# Patient Record
Sex: Female | Born: 1980 | Race: Black or African American | Hispanic: No | Marital: Married | Smoking: Never smoker
Health system: Southern US, Community
[De-identification: ages and names within clinical notes are randomized; demographics above are authoritative.]

## PROBLEM LIST (undated history)

## (undated) DIAGNOSIS — IMO0001 Reserved for inherently not codable concepts without codable children: Secondary | ICD-10-CM

## (undated) DIAGNOSIS — R112 Nausea with vomiting, unspecified: Secondary | ICD-10-CM

## (undated) DIAGNOSIS — E785 Hyperlipidemia, unspecified: Secondary | ICD-10-CM

## (undated) DIAGNOSIS — R739 Hyperglycemia, unspecified: Secondary | ICD-10-CM

## (undated) DIAGNOSIS — R012 Other cardiac sounds: Secondary | ICD-10-CM

## (undated) DIAGNOSIS — I498 Other specified cardiac arrhythmias: Secondary | ICD-10-CM

## (undated) DIAGNOSIS — E559 Vitamin D deficiency, unspecified: Secondary | ICD-10-CM

## (undated) DIAGNOSIS — R05 Cough: Secondary | ICD-10-CM

## (undated) DIAGNOSIS — T8859XA Other complications of anesthesia, initial encounter: Secondary | ICD-10-CM

## (undated) DIAGNOSIS — Z9889 Other specified postprocedural states: Secondary | ICD-10-CM

## (undated) DIAGNOSIS — I10 Essential (primary) hypertension: Secondary | ICD-10-CM

## (undated) DIAGNOSIS — R51 Headache: Secondary | ICD-10-CM

## (undated) DIAGNOSIS — R059 Cough, unspecified: Secondary | ICD-10-CM

## (undated) HISTORY — DX: Other specified cardiac arrhythmias: I49.8

## (undated) HISTORY — DX: Hyperglycemia, unspecified: R73.9

## (undated) HISTORY — PX: WISDOM TOOTH EXTRACTION: SHX21

## (undated) HISTORY — PX: NOVASURE ABLATION: SHX5394

## (undated) HISTORY — DX: Other cardiac sounds: R01.2

---

## 2001-08-04 ENCOUNTER — Other Ambulatory Visit: Admission: RE | Admit: 2001-08-04 | Discharge: 2001-08-04 | Payer: Self-pay | Admitting: Family Medicine

## 2001-08-11 ENCOUNTER — Encounter: Payer: Self-pay | Admitting: Family Medicine

## 2001-08-11 ENCOUNTER — Encounter: Admission: RE | Admit: 2001-08-11 | Discharge: 2001-08-11 | Payer: Self-pay | Admitting: Family Medicine

## 2003-02-22 ENCOUNTER — Other Ambulatory Visit: Admission: RE | Admit: 2003-02-22 | Discharge: 2003-02-22 | Payer: Self-pay | Admitting: Obstetrics and Gynecology

## 2003-05-24 ENCOUNTER — Other Ambulatory Visit: Admission: RE | Admit: 2003-05-24 | Discharge: 2003-05-24 | Payer: Self-pay | Admitting: Obstetrics and Gynecology

## 2003-12-03 ENCOUNTER — Ambulatory Visit (HOSPITAL_COMMUNITY): Admission: RE | Admit: 2003-12-03 | Discharge: 2003-12-03 | Payer: Self-pay | Admitting: Obstetrics and Gynecology

## 2003-12-10 ENCOUNTER — Encounter (INDEPENDENT_AMBULATORY_CARE_PROVIDER_SITE_OTHER): Payer: Self-pay | Admitting: Specialist

## 2003-12-10 ENCOUNTER — Inpatient Hospital Stay (HOSPITAL_COMMUNITY): Admission: AD | Admit: 2003-12-10 | Discharge: 2003-12-13 | Payer: Self-pay | Admitting: Obstetrics and Gynecology

## 2003-12-29 ENCOUNTER — Other Ambulatory Visit: Admission: RE | Admit: 2003-12-29 | Discharge: 2003-12-29 | Payer: Self-pay | Admitting: Obstetrics and Gynecology

## 2004-11-23 ENCOUNTER — Emergency Department (HOSPITAL_COMMUNITY): Admission: EM | Admit: 2004-11-23 | Discharge: 2004-11-23 | Payer: Self-pay | Admitting: Family Medicine

## 2005-06-26 IMAGING — US US OB COMP +14 WK
1 series · 13 of 28 positions shown · non-contrast
Comparison: none

CLINICAL DATA: 22-year-old.  G1 P0 with size greater than dates.  Evaluate fetal growth.  LMP 03/01/03 making the [REDACTED] weeks and 1 day.

[Series 1: unknown · 13 of 46 slices shown]
[im 2/46]
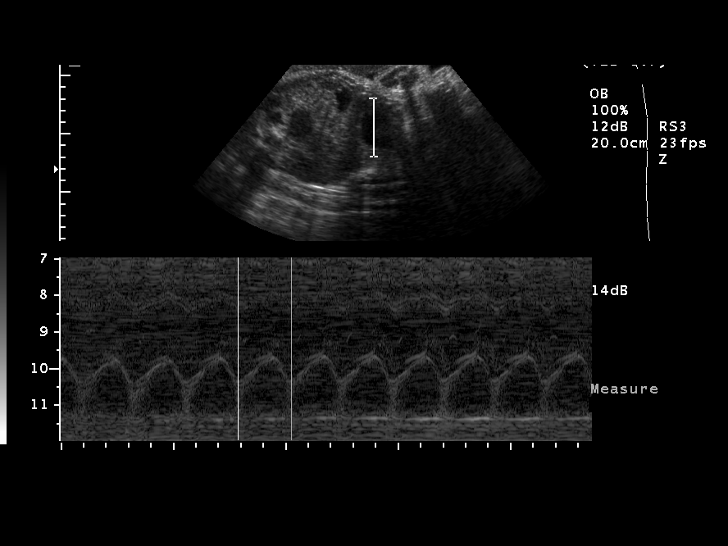
[im 6/46]
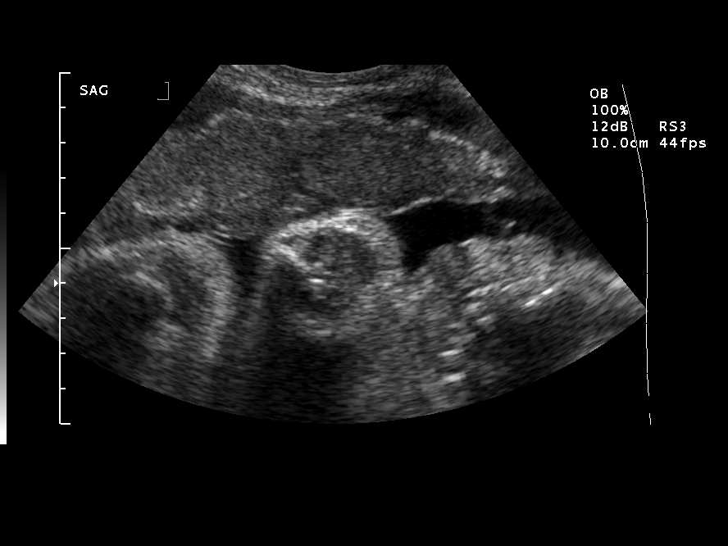
[im 9/46]
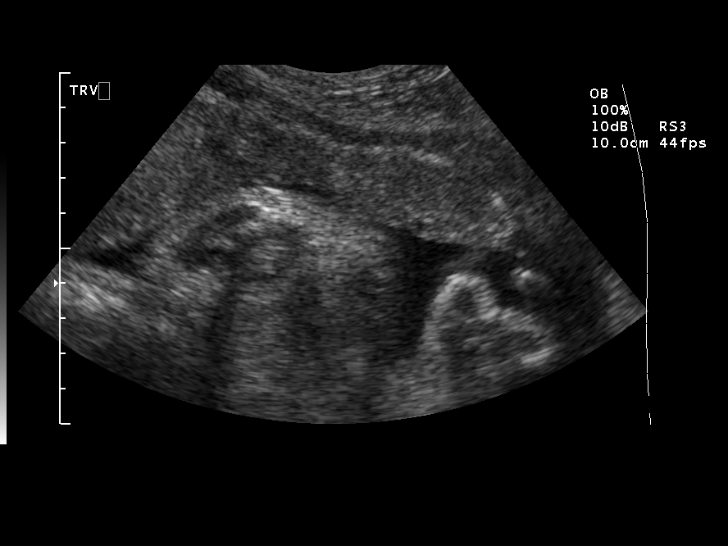
[im 12/46]
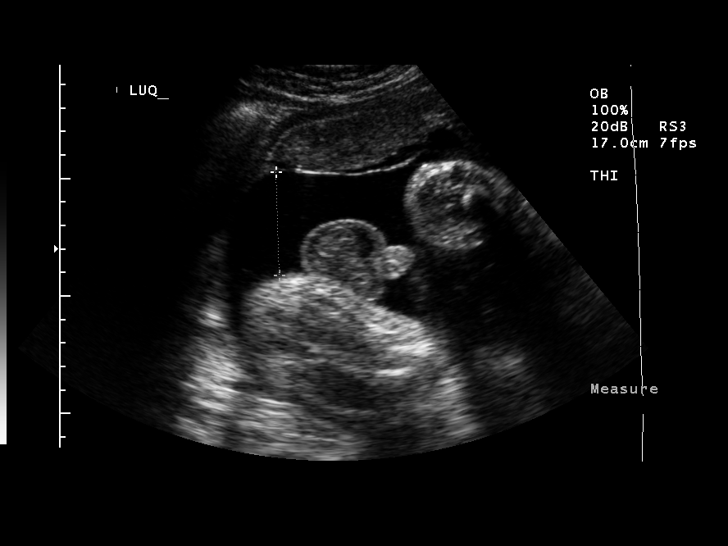
[im 16/46]
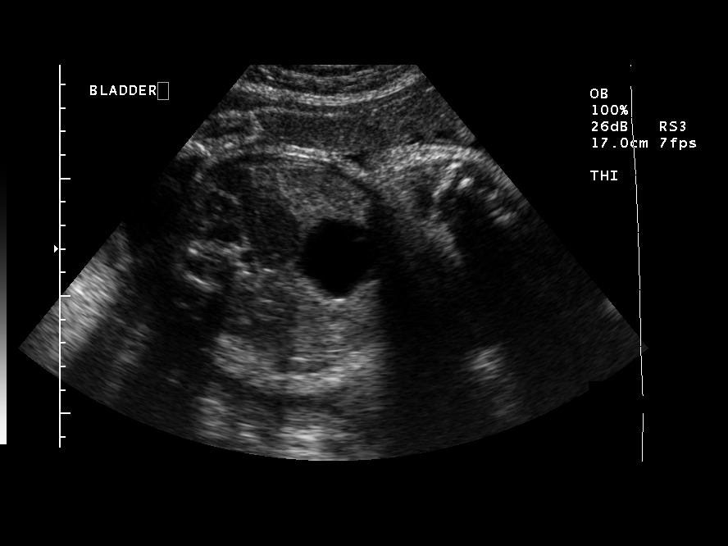
[im 19/46]
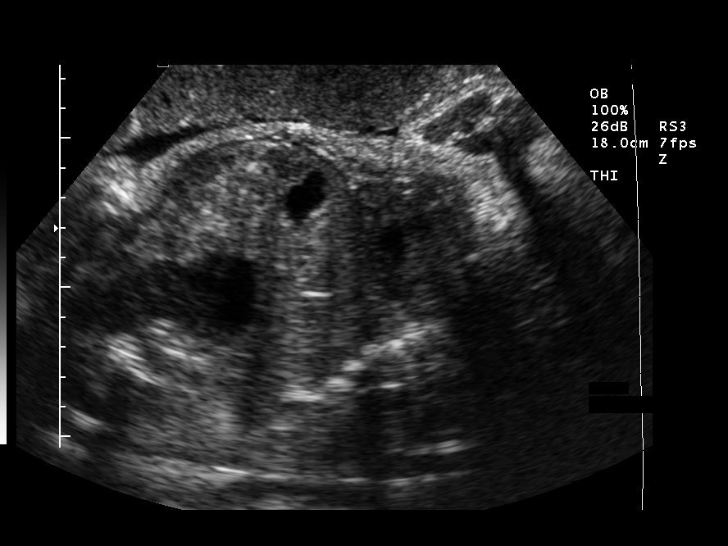
[im 24/46]
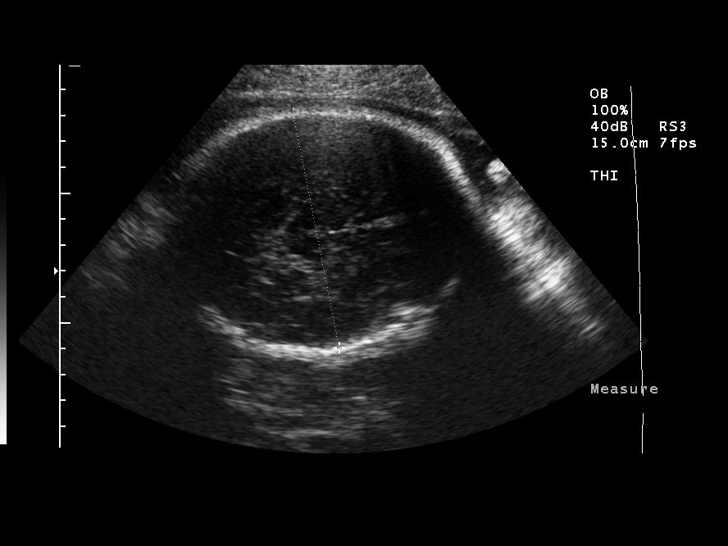
[im 27/46]
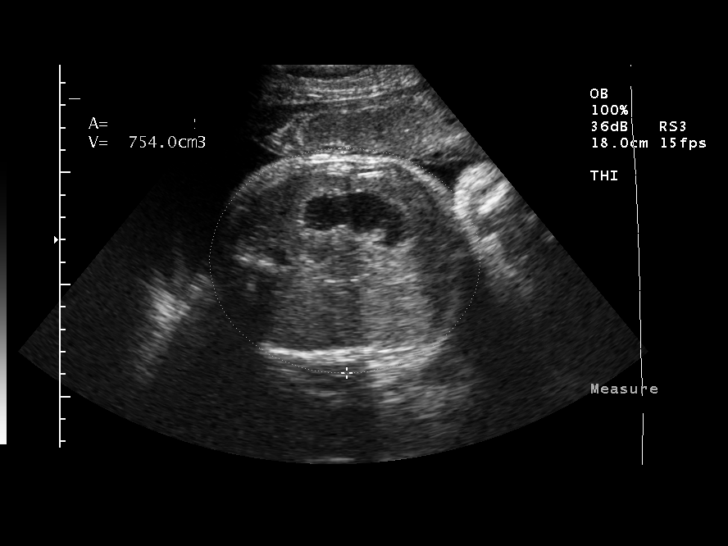
[im 31/46]
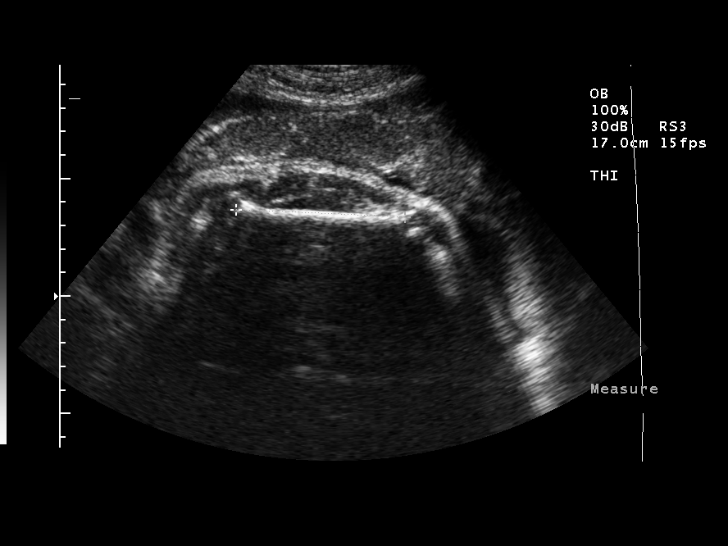
[im 34/46]
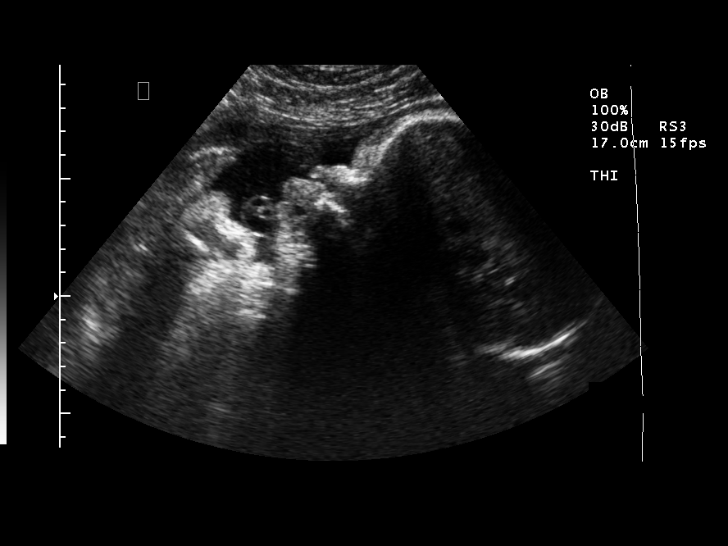
[im 37/46]
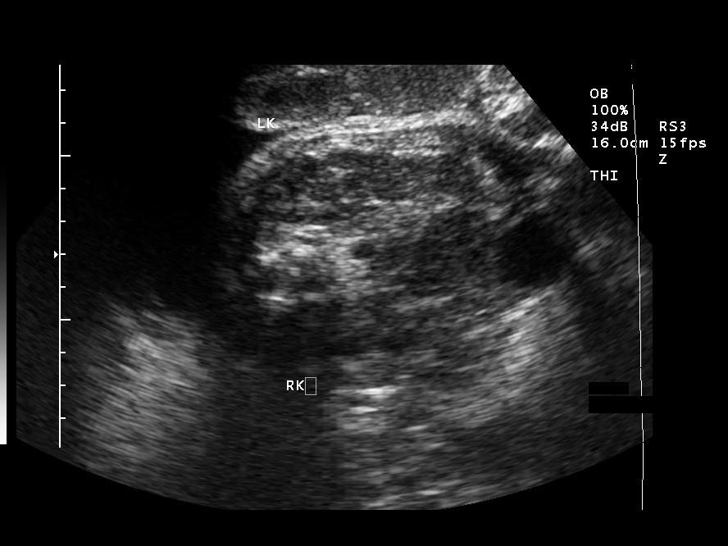
[im 41/46]
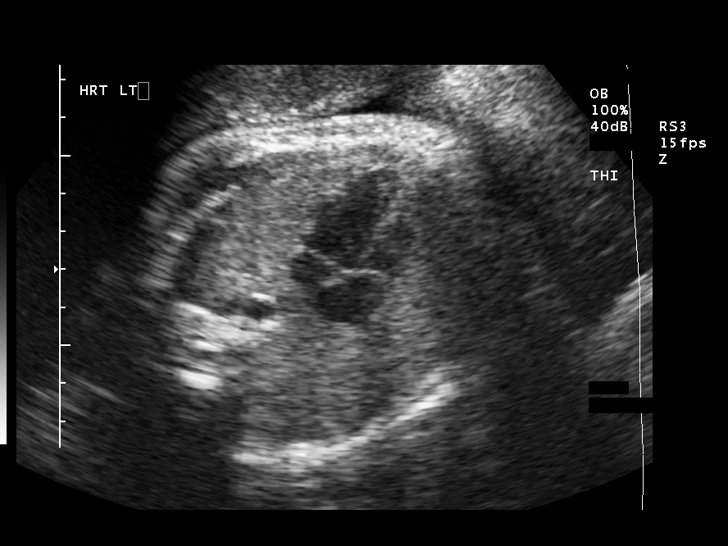
[im 44/46]
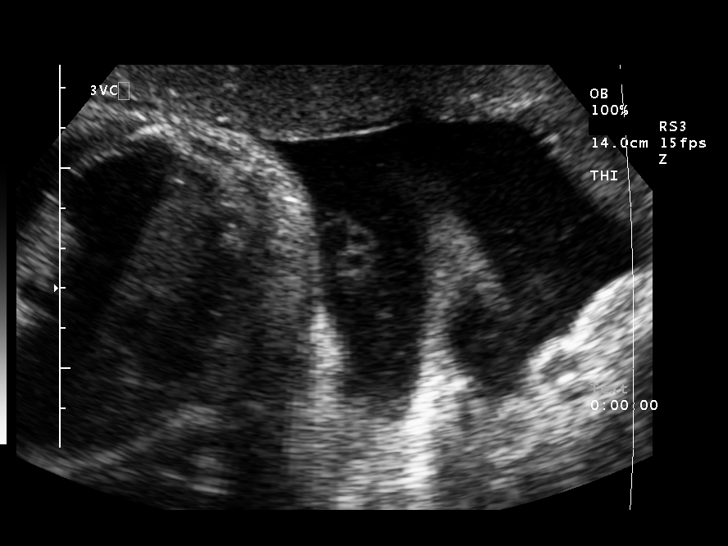

[13 of 28 positions shown; findings below may reference images not displayed]

OBSTETRICAL ULTRASOUND
 Number of Fetuses:  1
 Heart Rate:  127
 Movement:  Yes
 Breathing:  Yes  
 Presentation:  Transverse with head to maternal left
 Placental Location:  Anterior
 Grade:  II
 Previa:  No
 Amniotic Fluid (Subjective):  Normal
 Amniotic Fluid (Objective):   21.1 cm AFI (5th -95th%ile =   7.2 ? 22.6 cm for 39 wks)

 FETAL BIOMETRY
 BPD:   9.2 cm   37 w 1 d
 HC:   35.1 cm   40 w 6 d
 AC:   34.7 cm   38 w 5 d
 FL:    7.2 cm  36 w 6 d

 MEAN GA:  38 w 3 d

 EFW:  8280 g (H) 50th ? 75th%ile (0311 ? 5866 g) For 39 wks

 FETAL ANATOMY
 Lateral Ventricles:    Not visualized 
 Thalami/CSP:      Visualized 
 Posterior Fossa:  Not visualized 
 Nuchal Region:    N/A
 Spine:      Not visualized 
 4 Chamber Heart on Left:      Visualized 
 Stomach on Left:      Visualized 
 3 Vessel Cord:    Visualized 
 Cord Insertion site:    Not visualized 
 Kidneys:  Visualized 
 Bladder:  Visualized 
 Extremities:      Not visualized 

 ADDITIONAL ANATOMY VISUALIZED:  Upper lip, profile, diaphragm and male genitalia

 MATERNAL FINDINGS
 Cervix:   Not evaluated
IMPRESSION: Single living intrauterine fetus in transverse presentation.  Size and dates correlate well.  No fetal anomalies are seen.  Amniotic fluid volume is within normal limits.

 </u12:p>

## 2006-03-09 ENCOUNTER — Emergency Department (HOSPITAL_COMMUNITY): Admission: EM | Admit: 2006-03-09 | Discharge: 2006-03-09 | Payer: Self-pay | Admitting: Family Medicine

## 2006-07-09 ENCOUNTER — Inpatient Hospital Stay (HOSPITAL_COMMUNITY): Admission: AD | Admit: 2006-07-09 | Discharge: 2006-07-09 | Payer: Self-pay | Admitting: Obstetrics & Gynecology

## 2006-12-12 ENCOUNTER — Other Ambulatory Visit: Admission: RE | Admit: 2006-12-12 | Discharge: 2006-12-12 | Payer: Self-pay | Admitting: Obstetrics and Gynecology

## 2007-01-30 ENCOUNTER — Emergency Department (HOSPITAL_COMMUNITY): Admission: EM | Admit: 2007-01-30 | Discharge: 2007-01-30 | Payer: Self-pay | Admitting: Emergency Medicine

## 2007-03-04 ENCOUNTER — Ambulatory Visit (HOSPITAL_COMMUNITY): Admission: RE | Admit: 2007-03-04 | Discharge: 2007-03-04 | Payer: Self-pay | Admitting: Obstetrics and Gynecology

## 2007-04-11 ENCOUNTER — Inpatient Hospital Stay (HOSPITAL_COMMUNITY): Admission: AD | Admit: 2007-04-11 | Discharge: 2007-04-11 | Payer: Self-pay | Admitting: Obstetrics and Gynecology

## 2007-07-14 ENCOUNTER — Inpatient Hospital Stay (HOSPITAL_COMMUNITY): Admission: RE | Admit: 2007-07-14 | Discharge: 2007-07-17 | Payer: Self-pay | Admitting: Obstetrics and Gynecology

## 2009-03-18 ENCOUNTER — Emergency Department (HOSPITAL_COMMUNITY): Admission: EM | Admit: 2009-03-18 | Discharge: 2009-03-18 | Payer: Self-pay | Admitting: Family Medicine

## 2009-05-06 ENCOUNTER — Emergency Department (HOSPITAL_COMMUNITY): Admission: EM | Admit: 2009-05-06 | Discharge: 2009-05-06 | Payer: Self-pay | Admitting: Emergency Medicine

## 2009-07-23 ENCOUNTER — Emergency Department (HOSPITAL_COMMUNITY): Admission: EM | Admit: 2009-07-23 | Discharge: 2009-07-23 | Payer: Self-pay | Admitting: Family Medicine

## 2010-03-09 ENCOUNTER — Emergency Department (HOSPITAL_COMMUNITY): Admission: EM | Admit: 2010-03-09 | Discharge: 2010-03-09 | Payer: Self-pay | Admitting: Family Medicine

## 2010-08-23 ENCOUNTER — Other Ambulatory Visit: Payer: Self-pay | Admitting: Obstetrics and Gynecology

## 2010-11-06 LAB — POCT URINALYSIS DIP (DEVICE)
Bilirubin Urine: NEGATIVE
Ketones, ur: NEGATIVE mg/dL
Protein, ur: NEGATIVE mg/dL
Specific Gravity, Urine: 1.015 (ref 1.005–1.030)
pH: 6.5 (ref 5.0–8.0)

## 2010-12-19 NOTE — H&P (Signed)
[name], [name]                 ACCOUNT NO.:  [id number]   MEDICAL RECORD NO.:  [id number]          PATIENT TYPE:  INP   LOCATION:  NA                            [facility]   PHYSICIAN:  James A. Ashley Royalty, M.D.DATE OF BIRTH:  March 24, 1981   DATE OF ADMISSION:  07/14/2007  DATE OF DISCHARGE:                              HISTORY & PHYSICAL   This is a 30 year old gravida 2, para 1; EDC July 21, 2007; [redacted] weeks  gestation.  The patient had a previous cesarean section and desires  repeat cesarean section.   MEDICATIONS:  Vitamins.   PAST MEDICAL HISTORY:  Medical:  Negative.  Surgical:  As above.   ALLERGIES:  None.   FAMILY HISTORY:  Noncontributory.   SOCIAL HISTORY:  The patient denies the use of tobacco or significant  alcohol.   REVIEW OF SYSTEMS:  Noncontributory.   PHYSICAL EXAMINATION:  Well-developed, well-nourished, pleasant female.  No acute distress.  Afebrile, vital signs stable.  CHEST:  Lungs are clear.  CARDIAC:  Regular rate and rhythm.  ABDOMEN:  Gravid with a term fundal height.  Fetal heart tones are  auscultated and normal.  MUSCULOSKELETAL:  No CVA tenderness.  PELVIC:  Deferred.   IMPRESSION:  1. Intrauterine pregnancy at [redacted] weeks gestation.  2. Previous cesarean section with desire for repeat cesarean.   PLAN:  Repeat low transverse cesarean section.  Risks, benefits,  complications and alternatives fully discussed with the patient.  She  states she understands and accepts.  Questions are invited and answered.      James A. Ashley Royalty, M.D.  Electronically Signed     JAM/MEDQ  D:  07/14/2007  T:  07/14/2007  Job:  [id number]

## 2010-12-19 NOTE — Op Note (Signed)
[name], [name]                 ACCOUNT NO.:  [id number]   MEDICAL RECORD NO.:  [id number]          PATIENT TYPE:  INP   LOCATION:  9119                          [facility]   PHYSICIAN:  James A. Ashley Royalty, M.D.DATE OF BIRTH:  28-Mar-1981   DATE OF PROCEDURE:  07/14/2007  DATE OF DISCHARGE:                               OPERATIVE REPORT   PREOPERATIVE DIAGNOSIS:  Intrauterine pregnancy at [redacted] weeks gestation.  Previous cesarean section.   POSTOPERATIVE DIAGNOSIS:  Intrauterine pregnancy at [redacted] weeks gestation.  Previous cesarean section.   PROCEDURE:  Repeat low transverse cesarean section.   SURGEON:  Sylvester Harder, MD   ANESTHESIA:  Spinal.   FINDINGS:  6 pounds 5 ounces female, Apgars 8 at one minute, 9 at five  minutes, sent to newborn nursery.   ESTIMATED BLOOD LOSS:  500 mL.   COMPLICATIONS:  None.   PACKS AND DRAINS:  Foley.   SPONGE NEEDLE INSTRUMENT COUNT:  Reported as correct x2.   PROCEDURE:  The patient was taken to the operating room, placed in the  sitting position.  After spinal anesthetic was noted.  She was placed in  the dorsal supine position and prepped and draped in usual manner for  abdominal surgery.  Foley catheter was placed.  A Pfannenstiel incision  was made down to level of the fascia which was nicked with a knife,  incised transversely with Mayo scissors.  The underlying rectus muscles  separated from their fascia using sharp and blunt dissection.  Rectus  muscles were separated in the midline exposing the peritoneum which was  elevated with hemostats and entered atraumatically with Metzenbaum  scissors.  Incision was extended longitudinally.  There was a small  amount of omentum that was adherent to the anterior parietal peritoneum.  The uterus was identified and a bladder flap created by incising  intrauterine serosa and sharply and bluntly dissecting the bladder  inferiorly.  It was held in place with a bladder blade.  The uterus was  then entered through a low transverse incision using sharp and blunt  dissection.  The fluid was clear.  The infant was delivered from vertex  presentation in an atraumatic manner.  The infant was suctioned.  The  placenta and membranes were removed in their entirety and submitted to  the cord blood personnel for cord blood donation as well as regular cord  blood.  The uterus was exteriorized.  Uterus was then closed in two  running layers of #1 Vicryl.  The first was a running locking layer.  The second was a running, intermittently locking, and imbricating layer.  Two additional figure-of-eight sutures were required to obtain  hemostasis.  Hemostasis was noted.  Uterus, tubes and ovaries were  inspected and found be otherwise normal.  They were returned to the  abdominal cavity.  Copious irrigation was accomplished.  Hemostasis was  noted.  The small piece of omentum adherent to the anterior parietal  peritoneum was grasped with Kelly clamps divided and the pedicles  secured with 2-0 Vicryl ties.  Hemostasis was noted.   The peritoneum was  then closed with 3-0 Vicryl in a running fashion.  The fascia was closed with 0 Vicryl in a running fashion.  The skin was  closed with staples.   The patient tolerated procedure extremely well and was returned to the  recovery room in good condition.      James A. Ashley Royalty, M.D.  Electronically Signed     JAM/MEDQ  D:  07/14/2007  T:  07/15/2007  Job:  [id number]

## 2010-12-22 NOTE — Op Note (Signed)
[name], [name]                           ACCOUNT NO.:  [id number]   MEDICAL RECORD NO.:  [id number]                   PATIENT TYPE:  INP   LOCATION:  9122                                 [facility]   PHYSICIAN:  James A. Ashley Royalty, M.D.             DATE OF BIRTH:  09-27-1980   DATE OF PROCEDURE:  12/10/2003  DATE OF DISCHARGE:                                 OPERATIVE REPORT   PREOPERATIVE DIAGNOSES:  1. Intrauterine pregnancy at 40 weeks 1 day's gestation.  2. Oblique lie.   POSTOPERATIVE DIAGNOSES:  1. Intrauterine pregnancy at 40 weeks 1 day's gestation.  2. Meconium-stained amniotic fluid.   PROCEDURE:  Primary low transverse cesarean section.   SURGEON:  Rudy Jew. Ashley Royalty, M.D.   ASSISTANT:  Bing Neighbors. Sydnee Cabal, M.D.   FINDINGS:  A 7 pound 10 ounce female, Apgars 9 at one minute, 9 at five  minutes, sent to the newborn nursery.   ESTIMATED BLOOD LOSS:  800 mL.   COMPLICATIONS:  None.   PACKS AND DRAINS:  Foley.   Sponge, needle, and instrument count reported as correct x2.   PROCEDURE:  The patient was taken to the operating room and placed in the  sitting position.  After a spinal anesthetic was administered, she was  placed in the dorsal supine position and prepped and draped in the usual  manner for abdominal surgery.  A Foley catheter was placed.  A Pfannenstiel  incision was made down to the level of the fascia, which was nicked with a  knife, incised transversely with Mayo scissors.  The underlying rectus  muscles were separated from the overlying fascia using sharp and blunt  dissection.  The rectus muscles were separated in the midline exposing the  peritoneum, which was elevated with hemostats and entered atraumatically  with Metzenbaum scissors.  The incision was extended longitudinally.  The  uterus was identified and a bladder flap created by incising the anterior  uterine serosa and sharply and bluntly dissecting the bladder inferiorly.  It  was held in place with a bladder blade.  The uterus was then entered  through a low transverse incision using sharp and blunt dissection.  As the  amnion was incised, meconium-stained amniotic fluid was encountered.  The  infant was noted to be in an oblique position.  The head was in the right  lower quadrant.  The vertex was easily brought into the field and the infant  was then delivered from the vertex presentation.  DeLee suction was used  with delivery of the fetal head.  After the full body was delivered, the  cord was clamped, cut, and the infant given immediately to the awaiting  pediatrics team.  Arterial cord pH was obtained from an isolated segment of  cord.  Then regular cord was obtained.  The placenta and membranes were  removed in their entirety and submitted to pathology for histologic  studies.   The uterus was then closed in two running layers with #1 Vicryl.  The first  was a running locking layer.  The second was a running, intermittently  locking and imbricating layer. One additional figure-of-eight suture was  required to obtain hemostasis.  Hemostasis was noted.  The uterus, tubes,  and ovaries were inspected and found to be otherwise normal.  They were  returned to the abdominal cavity.  Copious irrigation was accomplished.  The  incision line was noted to be hemostatic.  The fascia was then closed with 0  Vicryl in a running fashion.  The skin was closed with staples.  The patient  tolerated the procedure extremely well and was returned to the recovery room  in good condition.                                               James A. Ashley Royalty, M.D.    JAM/MEDQ  D:  12/10/2003  T:  12/11/2003  Job:  [id number]

## 2010-12-22 NOTE — Discharge Summary (Signed)
[name], [name]                           ACCOUNT NO.:  [id number]   MEDICAL RECORD NO.:  [id number]                   PATIENT TYPE:  INP   LOCATION:  9122                                 [facility]   PHYSICIAN:  Charles A. Sydnee Cabal, MD            DATE OF BIRTH:  [birth year]   DATE OF ADMISSION:  12/10/2003  DATE OF DISCHARGE:  12/13/2003                                 DISCHARGE SUMMARY   PRIMARY DISCHARGE DIAGNOSES:  1. Intrauterine pregnancy at 40 weeks 1 day.  2. Oblique lie.   PRIMARY PROCEDURE:  Primary low transverse cesarean section.   HISTORY AND PHYSICAL:  See dictated note on the chart.   DISPOSITION:  The patient was discharged home to follow up with Dr. Ashley Royalty  in 10 days.  Staples were discontinued prior to discharge.  Percocet one to  two p.o. q.4h. p.r.n. prescription was given.  She was given convalescent  instructions to notify for temperature greater than 101, increased pain or  bleeding, incisional drainage, vaginal bleeding, nausea or vomiting.  She  was to convalesce at home, no driving for 2 weeks, pelvic rest for 4-6  weeks.   LABORATORY:  Postoperative hematocrit 32.6, hemoglobin 10.7 on Dec 11, 2003.   HOSPITAL COURSE:  The patient was admitted, underwent surgery as noted  above, cesarean section secondary to oblique lie at term.  Postoperatively  she had good pain control with Duramorph.  Postoperative day #1 she was  changed over to p.o. medication for pain - Percocet.  She did well.  Postoperative day #1 she voided without difficulty.  Foley catheter was  discontinued.  She had flatus return day #2.  A general diet was given.  She  tolerated the diet, was ambulating, voiding without difficulty.  Postoperative day #3, no noted complications, discharged home with follow-up  as noted above.   FINDINGS:  Vigorous female, 3480 gm, 51 cm in length.                                              Charles A. Sydnee Cabal, MD    CAD/MEDQ  D:   12/24/2003  T:  12/24/2003  Job:  [id number]

## 2010-12-22 NOTE — Discharge Summary (Signed)
NAMEJANACE, DECKER                           ACCOUNT NO.:  [id number]   MEDICAL RECORD NO.:  [id number]                   PATIENT TYPE:  INP   LOCATION:  9122                                 [facility]   PHYSICIAN:  Charles A. Sydnee Cabal, MD            DATE OF BIRTH:  [birth year]   DATE OF ADMISSION:  12/10/2003  DATE OF DISCHARGE:  12/13/2003                                 DISCHARGE SUMMARY   ADDENDUM:   FINDINGS:  Vigorous female, 3480 gm, 51 cm in length.                                               Charles A. Sydnee Cabal, MD    CAD/MEDQ  D:  12/24/2003  T:  12/24/2003  Job:  [id number]

## 2010-12-22 NOTE — Discharge Summary (Signed)
[name], [name]                 ACCOUNT NO.:  [id number]   MEDICAL RECORD NO.:  [id number]          PATIENT TYPE:  INP   LOCATION:  9119                          [facility]   PHYSICIAN:  James A. Ashley Royalty, M.D.DATE OF BIRTH:  January 14, 1981   DATE OF ADMISSION:  07/14/2007  DATE OF DISCHARGE:  07/17/2007                               DISCHARGE SUMMARY   DISCHARGE DIAGNOSES:  1. Intrauterine pregnancy at 12 weeks' gestation, delivered.  2. Previous cesarean section with desire for repeat cesarean.   OPERATIONS/PROCEDURES:  Repeat low transverse cesarean section.   COMPLICATIONS:  None.   DISCHARGE MEDICATIONS:  Percocet, Motrin 600 mg.   HISTORY AND PHYSICAL:  This is a 30 year old gravida 2, para 1 at 65  weeks' gestation.  She stated a desire for repeat cesarean section.  For  remaining history and physical, please see chart.   HOSPITAL COURSE:  The patient was admitted to [facility]  [place].  Admission laboratory studies were drawn.  She was taken to  the operating room on July 14, 2007 and underwent repeat low  transverse cesarean section which yielded a 6-pounds-5-ounce female,  Apgars 8 at 1 minute and 9 at 5 minutes, sent to the newborn nursery.  Delivery was accomplished by Sylvester Harder, M.D.  There were no  intraoperative complications.  The patient's postpartum course was  benign.  She was discharged on third postoperative day afebrile and in  satisfactory condition.   DISPOSITION:  The patient is to return to [facility] per  agreement for postpartum evaluation.      James A. Ashley Royalty, M.D.  Electronically Signed     JAM/MEDQ  D:  08/21/2007  T:  08/22/2007  Job:  [id number]

## 2010-12-22 NOTE — H&P (Signed)
[name], UTKE                           ACCOUNT NO.:  [id number]   MEDICAL RECORD NO.:  [id number]                   PATIENT TYPE:  INP   LOCATION:  NA                                   [facility]   PHYSICIAN:  James A. Ashley Royalty, M.D.             DATE OF BIRTH:  12/23/1980   DATE OF ADMISSION:  DATE OF DISCHARGE:                                HISTORY & PHYSICAL   HISTORY OF PRESENT ILLNESS:  A 30 year old primigravida with EDC Dec 09, 2003  at 40 weeks 1 day gestation.  Prenatal care has been essentially  uncomplicated.  The patient had an ultrasound at [facility] recently  for presentation and the result was transverse lie.  She is hence for  primary cesarean section assuming she has not converted to vertex.   MEDICATIONS:  Vitamins.   PAST MEDICAL HISTORY:  Medical:  Negative.  Surgical:  Negative.   ALLERGIES:  None.   FAMILY HISTORY:  Noncontributory.   SOCIAL HISTORY:  The patient denies the use of tobacco or significant  alcohol.   REVIEW OF SYSTEMS:  Noncontributory.   PHYSICAL EXAMINATION:  GENERAL:  Well-developed, well-nourished, pleasant  female, no acute distress.  VITAL SIGNS:  Afebrile, vital signs stable.  SKIN:  Warm and dry without lesions.  LYMPH:  There is no supraclavicular, cervical, or inguinal adenopathy.  HEENT:  Normocephalic.  NECK:  Supple without thyromegaly.  CHEST:  Lungs are clear.  CARDIAC:  Regular rate and rhythm without murmurs, gallops, or rubs.  BREAST:  Reveals no palpable mass, discharge retraction, or adenopathy.  ABDOMEN:  Gravid with a term fundal height.  Fetal heart tones are  auscultated with the Doppler.  MUSCULOSKELETAL:  Reveals full range of motion without edema, cyanosis, or  CVA tenderness.  PELVIC:  Deferred.   IMPRESSION:  1. Intrauterine pregnancy at 40 weeks 1 day gestation.  2. Transverse lie as of last ultrasound.   PLAN:  Will re-ultrasound the patient in the holding area prior to  transporting her to the operating room.  Assuming the lie is still  transverse she will be a candidate for a primary cesarean section.  Risks,  benefits, complications, and alternatives fully discussed with the patient.  She states she understands and accepts.  Questions invited and answered.                                               James A. Ashley Royalty, M.D.    JAM/MEDQ  D:  12/10/2003  T:  12/10/2003  Job:  [id number]

## 2011-01-15 LAB — HIV ANTIBODY (ROUTINE TESTING W REFLEX): HIV: NONREACTIVE

## 2011-01-15 LAB — RUBELLA ANTIBODY, IGM: Rubella: IMMUNE

## 2011-02-04 ENCOUNTER — Ambulatory Visit (INDEPENDENT_AMBULATORY_CARE_PROVIDER_SITE_OTHER): Payer: BC Managed Care – PPO

## 2011-02-04 ENCOUNTER — Encounter (HOSPITAL_COMMUNITY): Payer: Self-pay

## 2011-02-04 ENCOUNTER — Inpatient Hospital Stay (INDEPENDENT_AMBULATORY_CARE_PROVIDER_SITE_OTHER)
Admission: RE | Admit: 2011-02-04 | Discharge: 2011-02-04 | Disposition: A | Discharge status: Home / Self Care | Payer: BC Managed Care – PPO | Source: Ambulatory Visit | Attending: Emergency Medicine | Admitting: Emergency Medicine

## 2011-02-04 DIAGNOSIS — M25519 Pain in unspecified shoulder: Secondary | ICD-10-CM

## 2011-05-14 LAB — CBC
HCT: 34 — ABNORMAL LOW
Hemoglobin: 10.2 — ABNORMAL LOW
Hemoglobin: 11.5 — ABNORMAL LOW
MCHC: 33.6
MCHC: 33.7
MCV: 76.4 — ABNORMAL LOW
MCV: 76.5 — ABNORMAL LOW
Platelets: 239
RBC: 3.96
RDW: 14.3
WBC: 9.9

## 2011-05-18 LAB — URINALYSIS, ROUTINE W REFLEX MICROSCOPIC
Glucose, UA: NEGATIVE
Hgb urine dipstick: NEGATIVE
Ketones, ur: NEGATIVE
Protein, ur: NEGATIVE
pH: 5.5

## 2011-05-23 LAB — POCT I-STAT CREATININE
Creatinine, Ser: 0.6
Operator id: 247071

## 2011-05-23 LAB — I-STAT 8, (EC8 V) (CONVERTED LAB)
Acid-base deficit: 1
Chloride: 103
Glucose, Bld: 82
Hemoglobin: 13.6
Potassium: 3.7
Sodium: 134 — ABNORMAL LOW
TCO2: 25
pH, Ven: 7.4 — ABNORMAL HIGH

## 2011-05-23 LAB — POCT RAPID STREP A: Streptococcus, Group A Screen (Direct): NEGATIVE

## 2011-06-06 ENCOUNTER — Other Ambulatory Visit: Payer: Self-pay | Admitting: Obstetrics and Gynecology

## 2011-07-12 ENCOUNTER — Other Ambulatory Visit: Payer: Self-pay | Admitting: Obstetrics and Gynecology

## 2011-07-13 ENCOUNTER — Other Ambulatory Visit: Payer: Self-pay | Admitting: Obstetrics and Gynecology

## 2011-07-16 ENCOUNTER — Encounter (HOSPITAL_COMMUNITY): Payer: Self-pay | Admitting: Pharmacist

## 2011-07-19 ENCOUNTER — Encounter (HOSPITAL_COMMUNITY): Payer: Self-pay | Admitting: *Deleted

## 2011-07-19 ENCOUNTER — Inpatient Hospital Stay (HOSPITAL_COMMUNITY): Payer: BC Managed Care – PPO

## 2011-07-19 ENCOUNTER — Inpatient Hospital Stay (HOSPITAL_COMMUNITY)
Admission: AD | Admit: 2011-07-19 | Discharge: 2011-07-19 | Disposition: A | Discharge status: Home / Self Care | Payer: BC Managed Care – PPO | Source: Ambulatory Visit | Attending: Obstetrics and Gynecology | Admitting: Obstetrics and Gynecology

## 2011-07-19 DIAGNOSIS — O169 Unspecified maternal hypertension, unspecified trimester: Secondary | ICD-10-CM

## 2011-07-19 DIAGNOSIS — IMO0002 Reserved for concepts with insufficient information to code with codable children: Secondary | ICD-10-CM

## 2011-07-19 DIAGNOSIS — O139 Gestational [pregnancy-induced] hypertension without significant proteinuria, unspecified trimester: Secondary | ICD-10-CM | POA: Insufficient documentation

## 2011-07-19 LAB — COMPREHENSIVE METABOLIC PANEL
Alkaline Phosphatase: 161 U/L — ABNORMAL HIGH (ref 39–117)
BUN: 7 mg/dL (ref 6–23)
CO2: 23 mEq/L (ref 19–32)
GFR calc Af Amer: >90 mL/min (ref >90)
GFR calc non Af Amer: >90 mL/min (ref >90)
Glucose, Bld: 93 mg/dL (ref 70–99)
Potassium: 4 mEq/L (ref 3.5–5.1)
Total Bilirubin: 0.2 mg/dL — ABNORMAL LOW (ref 0.3–1.2)
Total Protein: 7.5 g/dL (ref 6.0–8.3)

## 2011-07-19 LAB — CBC
HCT: 35.2 % — ABNORMAL LOW (ref 36.0–46.0)
MCH: 23.6 pg — ABNORMAL LOW (ref 26.0–34.0)
MCHC: 31.5 g/dL (ref 30.0–36.0)
MCV: 74.9 fL — ABNORMAL LOW (ref 78.0–100.0)
Platelets: 275 10*3/uL (ref 150–400)
RDW: 14.5 % (ref 11.5–15.5)

## 2011-07-19 LAB — URINE MICROSCOPIC-ADD ON

## 2011-07-19 LAB — URINALYSIS, ROUTINE W REFLEX MICROSCOPIC
Bilirubin Urine: NEGATIVE
Glucose, UA: NEGATIVE mg/dL
Ketones, ur: NEGATIVE mg/dL
Protein, ur: NEGATIVE mg/dL
Urobilinogen, UA: 0.2 mg/dL (ref 0.0–1.0)

## 2011-07-19 NOTE — Progress Notes (Signed)
Sent from office, BP has been running high.  Denies headache, no visual changes or epigastric pain.  Ankles and hands a little swollen.

## 2011-07-19 NOTE — H&P (Addendum)
See full H+P by NP.    Pt sent over from office for eval of increased BPs.  BPs here initially elevated at 140/100s (highest 160/107)  but now are  consistently 147/87, 131/78, 135/78.  PIH labs were all normal.  NST is reactive but had one decel to 100s that appeared to be a moderate variable mixed with maternal HR.  BPP is 8/8 and AFI is 9.5 (23rd%ile).    Currently PIH only with no s/s of preeclampsia.  Antenatal testing is reassuring.  Will d/c home with precautions and kick counts.  RTC tomorrow for BP check at office.    [name] A

## 2011-07-19 NOTE — ED Provider Notes (Signed)
History     Chief Complaint  Patient presents with  . Hypertension   HPI Pt here for PIH eval.  BP was 150/90 in the office.  Dr. Henderson Cloud called to give orders Pt denies headache or abdominal pain.  She is having occ ctx not felt by pt.  She denies leakage of fluid or vaginal bleeding.  Pt has a history of borderline BP and has had elevated BP in her pregnancy. No past medical history on file.  No past surgical history on file.  No family history on file.  History  Substance Use Topics  . Smoking status: Not on file  . Smokeless tobacco: Not on file  . Alcohol Use: Not on file    Allergies: No Known Allergies  Prescriptions prior to admission  Medication Sig Dispense Refill  . prenatal vitamin w/FE, FA (PRENATAL 1 + 1) 27-1 MG TABS Take 1 tablet by mouth daily.          ROS Physical Exam   There were no vitals taken for this visit.  Physical Exam  MAU Course  Procedures  PIH labs normal and reported to Dr. Henderson Cloud along with serial blood pressures- will watch BPs and report to Dr. Henderson Cloud [facility] labs normal; last BP 139/88 FHR reactive however, 4 minute deceleration noted- will get BPP Dr. Henderson Cloud notified  BPP 8/8- reported to Dr. Henderson Cloud- AFI low normal ultrasound called- total AFI requested Assessment and Plan  Hypertension in pregnancy- normal PIH labs F/u with Dr. Sheralyn Boatman 07/19/2011, 11:22 AM

## 2011-07-19 NOTE — Progress Notes (Signed)
Pt sent over for pih eval from office due to elevated blood pressures.  Denies any headache, dizziness, blurred vision, or epigastric pain.  Denies any bleeding or leaking of fluid.  + FM.

## 2011-07-19 NOTE — Progress Notes (Signed)
The fetal tracing from [phone] belongs to the Patient with MR # [id number]. Pt was discharged home and this patient came in and was put on monitor under her MR#. See note in other chart.

## 2011-07-20 ENCOUNTER — Encounter (HOSPITAL_COMMUNITY): Payer: Self-pay | Admitting: *Deleted

## 2011-07-20 ENCOUNTER — Inpatient Hospital Stay (HOSPITAL_COMMUNITY)
Admission: AD | Admit: 2011-07-20 | Discharge: 2011-07-20 | Disposition: A | Discharge status: Home / Self Care | Payer: BC Managed Care – PPO | Source: Ambulatory Visit | Attending: Obstetrics and Gynecology | Admitting: Obstetrics and Gynecology

## 2011-07-20 DIAGNOSIS — IMO0002 Reserved for concepts with insufficient information to code with codable children: Secondary | ICD-10-CM

## 2011-07-20 DIAGNOSIS — O139 Gestational [pregnancy-induced] hypertension without significant proteinuria, unspecified trimester: Secondary | ICD-10-CM | POA: Insufficient documentation

## 2011-07-20 LAB — URINE CULTURE
Colony Count: >=100000
Culture  Setup Time: 201212140119

## 2011-07-20 MED ORDER — ACETAMINOPHEN 500 MG PO TABS
1000.0000 mg | ORAL_TABLET | Freq: Once | ORAL | Status: AC
  Administered 2011-07-20: 1000 mg via ORAL
  Filled 2011-07-20: qty 2

## 2011-07-20 NOTE — ED Provider Notes (Signed)
Chief Complaint:  Hypertension   [name] is  30 y.o. [id number].  No LMP recorded. Patient is pregnant..  [redacted]w[redacted]d   She presents complaining of Hypertension . Pt was sent for further evaluation of headache and elevated BP found in office today. States US and labs yesterday. Headache started today, + FM, no visual disturbance, no edema, no N/V.  Obstetrical/Gynecological History: OB History    Grav Para Term Preterm Abortions TAB SAB Ect Mult Living   3 2 2  0 0 0 0 0 0 2      Past Medical History: Past Medical History  Diagnosis Date  . No pertinent past medical history     Past Surgical History: Past Surgical History  Procedure Date  . Cesarean section     x2    Family History: Family History  Problem Relation Age of Onset  . Hypertension Mother   . Diabetes Mother   . Hypertension Father   . Hypertension Sister   . Anesthesia problems Neg Hx     Social History: History  Substance Use Topics  . Smoking status: Never Smoker   . Smokeless tobacco: Never Used  . Alcohol Use: No    Allergies: No Known Allergies  Prescriptions prior to admission  Medication Sig Dispense Refill  . prenatal vitamin w/FE, FA (PRENATAL 1 + 1) 27-1 MG TABS Take 1 tablet by mouth daily.          Review of Systems - Negative except what has been reviewed in the HPI  Physical Exam   Blood pressure 138/85, pulse 95, temperature 97.7 F (36.5 C), temperature source Oral, resp. rate 20, height 5\' 5"  (1.651 m), weight 238 lb (107.956 kg).  General: General appearance - alert, well appearing, and in no distress, oriented to person, place, and time and overweight Mental status - alert, oriented to person, place, and time, normal mood, behavior, speech, dress, motor activity, and thought processes, affect appropriate to mood Abdomen - gravid, non tender Extremities - peripheral pulses normal, no pedal edema, no clubbing or cyanosis, reflexes NL Focused Gynecological Exam: examination not  indicated FHR: Category I tracing  ED Course: Dr. Claiborne Billings notified of pt BP, FHR tracing by RN. Orders received by RN for discharge and tylenol.  Assessment: Gestational HTN  Plan: FU Monday in office for BP check. Scheduled for Repeat C/S on Wednesday Pre-x precautions reviewed  Lc Joynt E. 07/20/2011,1:14 PM

## 2011-07-20 NOTE — Progress Notes (Signed)
Sent by Dr. Henderson Cloud for elevated BP during pregnancy.  Seen in office this morning.

## 2011-07-20 NOTE — Patient Instructions (Addendum)
   Your procedure is scheduled on:  Wednesday, Dec 19th  Enter through the [employer] [facility] at: 7:00am Pick up the phone at the desk and dial [phone] and inform us of your arrival.  Please call this number if you have any problems the morning of surgery: [phone]  Remember: Do not eat food after midnight: Tuesday Do not drink clear liquids after: Tuesday Take these medicines the morning of surgery with a SIP OF WATER: None  Do not wear jewelry, make-up, or FINGER nail polish Do not wear lotions, powders, perfumes or deodorant. Do not shave 48 hours prior to surgery. Do not bring valuables to the hospital.  Leave suitcase in the car. After Surgery it may be brought to your room. For patients being admitted to the hospital, checkout time is 11:00am the day of discharge. Home with Husband [name]  cell [phone]  Remember to use your hibiclens as instructed.Please shower with 1/2 bottle the evening before your surgery and the other 1/2 bottle the morning of surgery.

## 2011-07-21 NOTE — ED Provider Notes (Signed)
ok 

## 2011-07-23 ENCOUNTER — Encounter (HOSPITAL_COMMUNITY): Payer: Self-pay

## 2011-07-23 ENCOUNTER — Inpatient Hospital Stay (HOSPITAL_COMMUNITY)
Admission: AD | Admit: 2011-07-23 | Discharge: 2011-07-23 | Disposition: A | Discharge status: Home / Self Care | Payer: BC Managed Care – PPO | Source: Ambulatory Visit | Attending: Obstetrics & Gynecology | Admitting: Obstetrics & Gynecology

## 2011-07-23 ENCOUNTER — Encounter (HOSPITAL_COMMUNITY)
Admission: RE | Admit: 2011-07-23 | Discharge: 2011-07-23 | Disposition: A | Discharge status: Home / Self Care | Payer: BC Managed Care – PPO | Source: Ambulatory Visit | Attending: Obstetrics and Gynecology | Admitting: Obstetrics and Gynecology

## 2011-07-23 DIAGNOSIS — R030 Elevated blood-pressure reading, without diagnosis of hypertension: Secondary | ICD-10-CM | POA: Insufficient documentation

## 2011-07-23 DIAGNOSIS — O99891 Other specified diseases and conditions complicating pregnancy: Secondary | ICD-10-CM | POA: Insufficient documentation

## 2011-07-23 HISTORY — DX: Cough, unspecified: R05.9

## 2011-07-23 HISTORY — DX: Reserved for inherently not codable concepts without codable children: IMO0001

## 2011-07-23 HISTORY — DX: Essential (primary) hypertension: I10

## 2011-07-23 HISTORY — DX: Cough: R05

## 2011-07-23 HISTORY — DX: Headache: R51

## 2011-07-23 LAB — CBC
HCT: 34.9 % — ABNORMAL LOW (ref 36.0–46.0)
HCT: 33.9 % — ABNORMAL LOW (ref 36.0–46.0)
Hemoglobin: 10.8 g/dL — ABNORMAL LOW (ref 12.0–15.0)
Hemoglobin: 10.9 g/dL — ABNORMAL LOW (ref 12.0–15.0)
MCH: 23.1 pg — ABNORMAL LOW (ref 26.0–34.0)
MCH: 23.9 pg — ABNORMAL LOW (ref 26.0–34.0)
MCHC: 30.9 g/dL (ref 30.0–36.0)
MCHC: 32.2 g/dL (ref 30.0–36.0)
MCV: 74.6 fL — ABNORMAL LOW (ref 78.0–100.0)
RBC: 4.56 MIL/uL (ref 3.87–5.11)
RDW: 15 % (ref 11.5–15.5)

## 2011-07-23 LAB — COMPREHENSIVE METABOLIC PANEL
ALT: 7 U/L (ref 0–35)
Alkaline Phosphatase: 168 U/L — ABNORMAL HIGH (ref 39–117)
BUN: 6 mg/dL (ref 6–23)
CO2: 22 mEq/L (ref 19–32)
Calcium: 8.9 mg/dL (ref 8.4–10.5)
GFR calc Af Amer: >90 mL/min (ref >90)
GFR calc non Af Amer: >90 mL/min (ref >90)
Glucose, Bld: 80 mg/dL (ref 70–99)
Sodium: 134 mEq/L — ABNORMAL LOW (ref 135–145)
Total Protein: 7 g/dL (ref 6.0–8.3)

## 2011-07-23 LAB — URINALYSIS, ROUTINE W REFLEX MICROSCOPIC
Glucose, UA: NEGATIVE mg/dL
Ketones, ur: NEGATIVE mg/dL
Protein, ur: NEGATIVE mg/dL
Urobilinogen, UA: 0.2 mg/dL (ref 0.0–1.0)

## 2011-07-23 LAB — URINE MICROSCOPIC-ADD ON

## 2011-07-23 LAB — LACTATE DEHYDROGENASE: LDH: 157 U/L (ref 94–250)

## 2011-07-23 MED ORDER — ACETAMINOPHEN 325 MG PO TABS
650.0000 mg | ORAL_TABLET | Freq: Once | ORAL | Status: AC
  Administered 2011-07-23: 650 mg via ORAL
  Filled 2011-07-23: qty 2

## 2011-07-23 NOTE — ED Provider Notes (Signed)
I reviewed note by Easton Rice, PA.  Her last 2 BP's were 120/80 and 145/86.  The fetal tracing is reactive and the patient reports good fetal activity.  All her PIH labs were normal and her urine protein was negative.  I do not feel that immediate delivery is indicated.  She will rest at home tonight and has an appointment to be seen by Dr. Horvath in the office tomorrow. 

## 2011-07-23 NOTE — ED Provider Notes (Signed)
History   Pt presents today for preeclamptic workup. She is scheduled for C/Section on Wed of this week. At her pre-op appt today, she was noted to have elevated BP and was sent to MAU for evaluation. She denies blurry vision, RUQ pain, or any other sx at this time. She reports GFM and denies vag dc or bleeding.  No chief complaint on file.  HPI  OB History    Grav Para Term Preterm Abortions TAB SAB Ect Mult Living   3 2 2  0 0 0 0 0 0 2      Past Medical History  Diagnosis Date  . Hypertension     prior to Pregnant, borderline but no meds  . Cold     sx 3 weeks ago - no meds  . Cough     sx x 3 weeks ago, no meds  . Headache      HA - tx w/otc med prn    Past Surgical History  Procedure Date  . Cesarean section 12/2003, 07/2007    x2    Family History  Problem Relation Age of Onset  . Hypertension Mother   . Diabetes Mother   . Hypertension Father   . Hypertension Sister   . Anesthesia problems Neg Hx     History  Substance Use Topics  . Smoking status: Never Smoker   . Smokeless tobacco: Never Used  . Alcohol Use: No    Allergies: No Known Allergies  Prescriptions prior to admission  Medication Sig Dispense Refill  . prenatal vitamin w/FE, FA (PRENATAL 1 + 1) 27-1 MG TABS Take 1 tablet by mouth daily.          Review of Systems  Constitutional: Negative for fever and malaise/fatigue.  Eyes: Negative for blurred vision.  Cardiovascular: Negative for chest pain and palpitations.  Gastrointestinal: Negative for nausea, vomiting, abdominal pain, diarrhea and constipation.  Genitourinary: Negative for dysuria, urgency, frequency and hematuria.  Neurological: Positive for headaches. Negative for dizziness.  Psychiatric/Behavioral: Negative for depression and suicidal ideas.   Physical Exam   There were no vitals taken for this visit.  Physical Exam  Nursing note and vitals reviewed. Constitutional: She is oriented to person, place, and time. She appears  well-developed and well-nourished. No distress.  HENT:  Head: Normocephalic and atraumatic.  Eyes: EOM are normal. Pupils are equal, round, and reactive to light.  GI: Soft. She exhibits no distension. There is no tenderness. There is no rebound and no guarding.  Neurological: She is alert and oriented to person, place, and time.  Skin: Skin is warm and dry. She is not diaphoretic.  Psychiatric: She has a normal mood and affect. Her behavior is normal. Judgment and thought content normal.    MAU Course  Procedures  Discussed pt with Dr. Arlyce Dice. He is on his way to evaluate pt.  Results for orders placed during the hospital encounter of 07/23/11 (from the past 24 hour(s))  URINALYSIS, ROUTINE W REFLEX MICROSCOPIC     Status: Abnormal   Collection Time   07/23/11  2:45 PM      Component Value Range   Color, Urine YELLOW  YELLOW    APPearance CLOUDY (*) CLEAR    Specific Gravity, Urine 1.010  1.005 - 1.030    pH 6.5  5.0 - 8.0    Glucose, UA NEGATIVE  NEGATIVE (mg/dL)   Hgb urine dipstick TRACE (*) NEGATIVE    Bilirubin Urine NEGATIVE  NEGATIVE    Ketones,  ur NEGATIVE  NEGATIVE (mg/dL)   Protein, ur NEGATIVE  NEGATIVE (mg/dL)   Urobilinogen, UA 0.2  0.0 - 1.0 (mg/dL)   Nitrite NEGATIVE  NEGATIVE    Leukocytes, UA LARGE (*) NEGATIVE   URINE MICROSCOPIC-ADD ON     Status: Abnormal   Collection Time   07/23/11  2:45 PM      Component Value Range   Squamous Epithelial / LPF MANY (*) RARE    WBC, UA 7-10  <3 (WBC/hpf)   Bacteria, UA FEW (*) RARE    Urine-Other MUCOUS PRESENT    CBC     Status: Abnormal   Collection Time   07/23/11  2:46 PM      Component Value Range   WBC 11.0 (*) 4.0 - 10.5 (K/uL)   RBC 4.56  3.87 - 5.11 (MIL/uL)   Hemoglobin 10.9 (*) 12.0 - 15.0 (g/dL)   HCT 16.1 (*) 09.6 - 46.0 (%)   MCV 74.3 (*) 78.0 - 100.0 (fL)   MCH 23.9 (*) 26.0 - 34.0 (pg)   MCHC 32.2  30.0 - 36.0 (g/dL)   RDW 04.5  40.9 - 81.1 (%)   Platelets 216  150 - 400 (K/uL)  COMPREHENSIVE  METABOLIC PANEL     Status: Abnormal   Collection Time   07/23/11  2:46 PM      Component Value Range   Sodium 134 (*) 135 - 145 (mEq/L)   Potassium 4.0  3.5 - 5.1 (mEq/L)   Chloride 101  96 - 112 (mEq/L)   CO2 22  19 - 32 (mEq/L)   Glucose, Bld 80  70 - 99 (mg/dL)   BUN 6  6 - 23 (mg/dL)   Creatinine, Ser 9.14  0.50 - 1.10 (mg/dL)   Calcium 8.9  8.4 - 78.2 (mg/dL)   Total Protein 7.0  6.0 - 8.3 (g/dL)   Albumin 2.6 (*) 3.5 - 5.2 (g/dL)   AST 11  0 - 37 (U/L)   ALT 7  0 - 35 (U/L)   Alkaline Phosphatase 168 (*) 39 - 117 (U/L)   Total Bilirubin 0.2 (*) 0.3 - 1.2 (mg/dL)   GFR calc non Af Amer >90  >90 (mL/min)   GFR calc Af Amer >90  >90 (mL/min)  LACTATE DEHYDROGENASE     Status: Normal   Collection Time   07/23/11  2:46 PM      Component Value Range   LD 157  94 - 250 (U/L)  URIC ACID     Status: Normal   Collection Time   07/23/11  2:46 PM      Component Value Range   Uric Acid, Serum 4.0  2.4 - 7.0 (mg/dL)     Assessment and Plan  Dr. Arlyce Dice to assume care of this pt.  Clinton Gallant. Rice III, DrHSc, MPAS, PA-C  07/23/2011, 2:50 PM   Henrietta Hoover, PA 07/23/11 1556

## 2011-07-23 NOTE — ED Provider Notes (Signed)
I reviewed note by Henrietta Hoover, PA.  Her last 2 BP's were 120/80 and 145/86.  The fetal tracing is reactive and the patient reports good fetal activity.  All her PIH labs were normal and her urine protein was negative.  I do not feel that immediate delivery is indicated.  She will rest at home tonight and has an appointment to be seen by Dr. Henderson Cloud in the office tomorrow.

## 2011-07-23 NOTE — ED Notes (Signed)
Care assumed from [facility] RN

## 2011-07-23 NOTE — Pre-Procedure Instructions (Signed)
Spoke with Dr Rodman Pickle regarding patient's elevated BP at PAT appt  - 163/107 w/120pulse and at end of the PAT appt 161/101 w/106 pulse.  Patient has been on bedrest since Friday, Dec 14th for elevated BP.  Patient was in MAU on Thursday and Friday last week for elevated bp 160/107 and was D/C with BP 138/65.  Instructed to call Dr Henderson Cloud, reviewed information, instructed to take patient down to MAU for evaluation.

## 2011-07-23 NOTE — Progress Notes (Signed)
Schedule C/S Wednesday, had PAT appt today and BP 163/107 and 161/101, MD sent to MAU for evaluation

## 2011-07-24 ENCOUNTER — Encounter (HOSPITAL_COMMUNITY): Payer: Self-pay | Admitting: Registered Nurse

## 2011-07-24 ENCOUNTER — Encounter (HOSPITAL_COMMUNITY): Payer: Self-pay | Admitting: *Deleted

## 2011-07-24 ENCOUNTER — Inpatient Hospital Stay (HOSPITAL_COMMUNITY)
Admission: AD | Admit: 2011-07-24 | Discharge: 2011-07-27 | DRG: 371 | Disposition: A | Discharge status: Home / Self Care | Payer: BC Managed Care – PPO | Source: Ambulatory Visit | Attending: Obstetrics and Gynecology | Admitting: Obstetrics and Gynecology

## 2011-07-24 ENCOUNTER — Encounter (HOSPITAL_COMMUNITY)
Admission: AD | Disposition: A | Discharge status: Home / Self Care | Payer: Self-pay | Source: Ambulatory Visit | Attending: Obstetrics and Gynecology

## 2011-07-24 ENCOUNTER — Inpatient Hospital Stay (HOSPITAL_COMMUNITY): Payer: BC Managed Care – PPO | Admitting: Registered Nurse

## 2011-07-24 DIAGNOSIS — Z9889 Other specified postprocedural states: Secondary | ICD-10-CM

## 2011-07-24 DIAGNOSIS — Z302 Encounter for sterilization: Secondary | ICD-10-CM

## 2011-07-24 DIAGNOSIS — O34219 Maternal care for unspecified type scar from previous cesarean delivery: Principal | ICD-10-CM | POA: Diagnosis present

## 2011-07-24 LAB — TYPE AND SCREEN: ABO/RH(D): O POS

## 2011-07-24 LAB — CBC
Platelets: 225 10*3/uL (ref 150–400)
RBC: 4.6 MIL/uL (ref 3.87–5.11)
WBC: 10.1 10*3/uL (ref 4.0–10.5)

## 2011-07-24 SURGERY — Surgical Case
Anesthesia: Regional | Site: Abdomen | Wound class: Clean Contaminated

## 2011-07-24 MED ORDER — METOCLOPRAMIDE HCL 5 MG/ML IJ SOLN
INTRAMUSCULAR | Status: AC
  Administered 2011-07-24: 10 mg via INTRAVENOUS
  Filled 2011-07-24: qty 2

## 2011-07-24 MED ORDER — METHYLERGONOVINE MALEATE 0.2 MG/ML IJ SOLN: 0.2000 mg | INTRAMUSCULAR | Status: DC | PRN

## 2011-07-24 MED ORDER — LACTATED RINGERS IV BOLUS (SEPSIS)
500.0000 mL | Freq: Once | INTRAVENOUS | Status: AC
  Administered 2011-07-24: 1000 mL via INTRAVENOUS

## 2011-07-24 MED ORDER — LABETALOL HCL 5 MG/ML IV SOLN
10.0000 mg | Freq: Every day | INTRAVENOUS | Status: AC | PRN
  Administered 2011-07-24: 10 mg via INTRAVENOUS
  Filled 2011-07-24: qty 4

## 2011-07-24 MED ORDER — SODIUM CHLORIDE 0.9 % IV SOLN: 1.0000 ug/kg/h | INTRAVENOUS | Status: DC | PRN

## 2011-07-24 MED ORDER — CITRIC ACID-SODIUM CITRATE 334-500 MG/5ML PO SOLN
30.0000 mL | Freq: Once | ORAL | Status: AC
  Administered 2011-07-24: 30 mL via ORAL
  Filled 2011-07-24: qty 15

## 2011-07-24 MED ORDER — METOCLOPRAMIDE HCL 5 MG/ML IJ SOLN
10.0000 mg | Freq: Three times a day (TID) | INTRAMUSCULAR | Status: DC | PRN
  Administered 2011-07-24: 10 mg via INTRAVENOUS

## 2011-07-24 MED ORDER — MENTHOL 3 MG MT LOZG: 1.0000 | LOZENGE | OROMUCOSAL | Status: DC | PRN

## 2011-07-24 MED ORDER — SCOPOLAMINE 1 MG/3DAYS TD PT72
1.0000 | MEDICATED_PATCH | Freq: Once | TRANSDERMAL | Status: DC
  Administered 2011-07-24: 1.5 mg via TRANSDERMAL

## 2011-07-24 MED ORDER — PHENYLEPHRINE HCL 10 MG/ML IJ SOLN
INTRAMUSCULAR | Status: DC | PRN
  Administered 2011-07-24 (×4): 80 ug via INTRAVENOUS

## 2011-07-24 MED ORDER — OXYCODONE-ACETAMINOPHEN 5-325 MG PO TABS
1.0000 | ORAL_TABLET | ORAL | Status: DC | PRN
  Administered 2011-07-25 – 2011-07-26 (×7): 1 via ORAL
  Administered 2011-07-27: 2 via ORAL
  Administered 2011-07-27: 1 via ORAL
  Administered 2011-07-27: 2 via ORAL
  Filled 2011-07-24 (×3): qty 1
  Filled 2011-07-24: qty 2
  Filled 2011-07-24 (×4): qty 1
  Filled 2011-07-24: qty 2
  Filled 2011-07-24: qty 1

## 2011-07-24 MED ORDER — FERROUS SULFATE 325 (65 FE) MG PO TABS
325.0000 mg | ORAL_TABLET | Freq: Two times a day (BID) | ORAL | Status: DC
  Administered 2011-07-25 – 2011-07-27 (×4): 325 mg via ORAL
  Filled 2011-07-24 (×4): qty 1

## 2011-07-24 MED ORDER — ONDANSETRON 8 MG/NS 50 ML IVPB
8.0000 mg | Freq: Once | INTRAVENOUS | Status: AC
  Administered 2011-07-24: 8 mg via INTRAVENOUS
  Filled 2011-07-24: qty 8

## 2011-07-24 MED ORDER — DIPHENHYDRAMINE HCL 50 MG/ML IJ SOLN: 25.0000 mg | INTRAMUSCULAR | Status: DC | PRN

## 2011-07-24 MED ORDER — NALOXONE HCL 0.4 MG/ML IJ SOLN: 0.4000 mg | INTRAMUSCULAR | Status: DC | PRN

## 2011-07-24 MED ORDER — MEPERIDINE HCL 25 MG/ML IJ SOLN: 6.2500 mg | INTRAMUSCULAR | Status: DC | PRN

## 2011-07-24 MED ORDER — ONDANSETRON HCL 4 MG/2ML IJ SOLN
INTRAMUSCULAR | Status: DC | PRN
  Administered 2011-07-24: 4 mg via INTRAVENOUS

## 2011-07-24 MED ORDER — FAMOTIDINE IN NACL 20-0.9 MG/50ML-% IV SOLN: 20.0000 mg | Freq: Once | INTRAVENOUS | Status: DC

## 2011-07-24 MED ORDER — ZOLPIDEM TARTRATE 5 MG PO TABS: 5.0000 mg | ORAL_TABLET | Freq: Every evening | ORAL | Status: DC | PRN

## 2011-07-24 MED ORDER — LANOLIN HYDROUS EX OINT: 1.0000 "application " | TOPICAL_OINTMENT | CUTANEOUS | Status: DC | PRN

## 2011-07-24 MED ORDER — WITCH HAZEL-GLYCERIN EX PADS: 1.0000 "application " | MEDICATED_PAD | CUTANEOUS | Status: DC | PRN

## 2011-07-24 MED ORDER — KETOROLAC TROMETHAMINE 60 MG/2ML IM SOLN
60.0000 mg | Freq: Once | INTRAMUSCULAR | Status: AC | PRN
  Administered 2011-07-24: 60 mg via INTRAMUSCULAR

## 2011-07-24 MED ORDER — ONDANSETRON HCL 4 MG/2ML IJ SOLN: 4.0000 mg | Freq: Three times a day (TID) | INTRAMUSCULAR | Status: DC | PRN

## 2011-07-24 MED ORDER — DIBUCAINE 1 % RE OINT: 1.0000 "application " | TOPICAL_OINTMENT | RECTAL | Status: DC | PRN

## 2011-07-24 MED ORDER — TETANUS-DIPHTH-ACELL PERTUSSIS 5-2.5-18.5 LF-MCG/0.5 IM SUSP
0.5000 mL | Freq: Once | INTRAMUSCULAR | Status: AC
  Administered 2011-07-25: 0.5 mL via INTRAMUSCULAR

## 2011-07-24 MED ORDER — SCOPOLAMINE 1 MG/3DAYS TD PT72
MEDICATED_PATCH | TRANSDERMAL | Status: AC
  Administered 2011-07-24: 1.5 mg via TRANSDERMAL
  Filled 2011-07-24: qty 1

## 2011-07-24 MED ORDER — LACTATED RINGERS IV SOLN
INTRAVENOUS | Status: DC
  Administered 2011-07-24 (×3): via INTRAVENOUS

## 2011-07-24 MED ORDER — SIMETHICONE 80 MG PO CHEW: 80.0000 mg | CHEWABLE_TABLET | ORAL | Status: DC | PRN

## 2011-07-24 MED ORDER — OXYTOCIN 20 UNITS IN LACTATED RINGERS INFUSION - SIMPLE: 125.0000 mL/h | INTRAVENOUS | Status: AC

## 2011-07-24 MED ORDER — FLEET ENEMA 7-19 GM/118ML RE ENEM: 1.0000 | ENEMA | Freq: Every day | RECTAL | Status: DC | PRN

## 2011-07-24 MED ORDER — ONDANSETRON HCL 4 MG/2ML IJ SOLN
4.0000 mg | INTRAMUSCULAR | Status: DC | PRN
  Administered 2011-07-24: 4 mg via INTRAVENOUS
  Filled 2011-07-24: qty 2

## 2011-07-24 MED ORDER — NALBUPHINE HCL 10 MG/ML IJ SOLN
5.0000 mg | INTRAMUSCULAR | Status: DC | PRN
Start: 2011-07-24 — End: 2011-07-27

## 2011-07-24 MED ORDER — FAMOTIDINE IN NACL 20-0.9 MG/50ML-% IV SOLN
INTRAVENOUS | Status: AC
  Administered 2011-07-24: 20 mg
  Filled 2011-07-24: qty 50

## 2011-07-24 MED ORDER — KETOROLAC TROMETHAMINE 60 MG/2ML IM SOLN
INTRAMUSCULAR | Status: AC
  Administered 2011-07-24: 60 mg via INTRAMUSCULAR
  Filled 2011-07-24: qty 2

## 2011-07-24 MED ORDER — CEFAZOLIN SODIUM-DEXTROSE 2-3 GM-% IV SOLR: 2.0000 g | INTRAVENOUS | Status: AC

## 2011-07-24 MED ORDER — DIPHENHYDRAMINE HCL 25 MG PO CAPS: 25.0000 mg | ORAL_CAPSULE | Freq: Four times a day (QID) | ORAL | Status: DC | PRN

## 2011-07-24 MED ORDER — SODIUM CHLORIDE 0.9 % IJ SOLN: 3.0000 mL | INTRAMUSCULAR | Status: DC | PRN

## 2011-07-24 MED ORDER — PROMETHAZINE HCL 25 MG/ML IJ SOLN
6.2500 mg | INTRAMUSCULAR | Status: DC | PRN
  Administered 2011-07-24: 6.25 mg via INTRAVENOUS
  Filled 2011-07-24: qty 1

## 2011-07-24 MED ORDER — ONDANSETRON HCL 4 MG PO TABS: 4.0000 mg | ORAL_TABLET | ORAL | Status: DC | PRN

## 2011-07-24 MED ORDER — SIMETHICONE 80 MG PO CHEW
80.0000 mg | CHEWABLE_TABLET | Freq: Three times a day (TID) | ORAL | Status: DC
  Administered 2011-07-25 – 2011-07-26 (×7): 80 mg via ORAL

## 2011-07-24 MED ORDER — KETOROLAC TROMETHAMINE 30 MG/ML IJ SOLN: 30.0000 mg | Freq: Four times a day (QID) | INTRAMUSCULAR | Status: AC | PRN

## 2011-07-24 MED ORDER — OXYTOCIN 20 UNITS IN LACTATED RINGERS INFUSION - SIMPLE
INTRAVENOUS | Status: DC | PRN
  Administered 2011-07-24 (×2): 20 [IU] via INTRAVENOUS

## 2011-07-24 MED ORDER — CEFAZOLIN SODIUM 1-5 GM-% IV SOLN
INTRAVENOUS | Status: DC | PRN
  Administered 2011-07-24: 2 g via INTRAVENOUS

## 2011-07-24 MED ORDER — MEASLES, MUMPS & RUBELLA VAC ~~LOC~~ INJ
0.5000 mL | INJECTION | Freq: Once | SUBCUTANEOUS | Status: DC
  Filled 2011-07-24: qty 0.5

## 2011-07-24 MED ORDER — DIPHENHYDRAMINE HCL 50 MG/ML IJ SOLN: 12.5000 mg | INTRAMUSCULAR | Status: DC | PRN

## 2011-07-24 MED ORDER — DIPHENHYDRAMINE HCL 25 MG PO CAPS: 25.0000 mg | ORAL_CAPSULE | ORAL | Status: DC | PRN

## 2011-07-24 MED ORDER — FENTANYL CITRATE 0.05 MG/ML IJ SOLN: 25.0000 ug | INTRAMUSCULAR | Status: DC | PRN

## 2011-07-24 MED ORDER — FENTANYL CITRATE 0.05 MG/ML IJ SOLN
INTRAMUSCULAR | Status: DC | PRN
  Administered 2011-07-24: 25 ug via INTRATHECAL

## 2011-07-24 MED ORDER — IBUPROFEN 600 MG PO TABS
600.0000 mg | ORAL_TABLET | Freq: Four times a day (QID) | ORAL | Status: DC
  Administered 2011-07-25 – 2011-07-27 (×11): 600 mg via ORAL
  Filled 2011-07-24 (×9): qty 1
  Filled 2011-07-24: qty 2

## 2011-07-24 MED ORDER — NALBUPHINE HCL 10 MG/ML IJ SOLN: 5.0000 mg | INTRAMUSCULAR | Status: DC | PRN

## 2011-07-24 MED ORDER — MORPHINE SULFATE (PF) 0.5 MG/ML IJ SOLN
INTRAMUSCULAR | Status: DC | PRN
  Administered 2011-07-24: .15 mg via INTRATHECAL

## 2011-07-24 MED ORDER — SENNOSIDES-DOCUSATE SODIUM 8.6-50 MG PO TABS
2.0000 | ORAL_TABLET | Freq: Every day | ORAL | Status: DC
  Administered 2011-07-25: 2 via ORAL

## 2011-07-24 MED ORDER — METHYLERGONOVINE MALEATE 0.2 MG PO TABS: 0.2000 mg | ORAL_TABLET | ORAL | Status: DC | PRN

## 2011-07-24 MED ORDER — LACTATED RINGERS IV SOLN
INTRAVENOUS | Status: DC
  Administered 2011-07-25: via INTRAVENOUS

## 2011-07-24 MED ORDER — PRENATAL MULTIVITAMIN CH
1.0000 | ORAL_TABLET | Freq: Every day | ORAL | Status: DC
  Administered 2011-07-25 – 2011-07-27 (×3): 1 via ORAL
  Filled 2011-07-24 (×3): qty 1

## 2011-07-24 MED ORDER — IBUPROFEN 600 MG PO TABS: 600.0000 mg | ORAL_TABLET | Freq: Four times a day (QID) | ORAL | Status: DC | PRN

## 2011-07-24 MED ORDER — BISACODYL 10 MG RE SUPP: 10.0000 mg | Freq: Every day | RECTAL | Status: DC | PRN

## 2011-07-24 SURGICAL SUPPLY — 30 items
CHLORAPREP W/TINT 26ML (MISCELLANEOUS) ×2 IMPLANT
CLIP FILSHIE TUBAL LIGA STRL (Clip) ×2 IMPLANT
CLOTH BEACON ORANGE TIMEOUT ST (SAFETY) ×2 IMPLANT
CONTAINER PREFILL 10% NBF 15ML (MISCELLANEOUS) IMPLANT
DRSG COVADERM 4X6 (GAUZE/BANDAGES/DRESSINGS) ×1 IMPLANT
ELECT REM PT RETURN 9FT ADLT (ELECTROSURGICAL) ×2
ELECTRODE REM PT RTRN 9FT ADLT (ELECTROSURGICAL) ×1 IMPLANT
EXTRACTOR VACUUM BELL STYLE (SUCTIONS) IMPLANT
GLOVE BIO SURGEON STRL SZ7 (GLOVE) ×4 IMPLANT
GOWN PREVENTION PLUS LG XLONG (DISPOSABLE) ×6 IMPLANT
KIT ABG SYR 3ML LUER SLIP (SYRINGE) IMPLANT
NDL HYPO 25X5/8 SAFETYGLIDE (NEEDLE) ×1 IMPLANT
NEEDLE HYPO 25X5/8 SAFETYGLIDE (NEEDLE) ×2 IMPLANT
NS IRRIG 1000ML POUR BTL (IV SOLUTION) ×2 IMPLANT
PACK C SECTION WH (CUSTOM PROCEDURE TRAY) ×2 IMPLANT
RETRACTOR WND ALEXIS 25 LRG (MISCELLANEOUS) ×1 IMPLANT
RTRCTR WOUND ALEXIS 25CM LRG (MISCELLANEOUS) ×2
SLEEVE SCD COMPRESS KNEE MED (MISCELLANEOUS) IMPLANT
STAPLER VISISTAT 35W (STAPLE) IMPLANT
SUT MNCRL 0 VIOLET CTX 36 (SUTURE) ×2 IMPLANT
SUT MONOCRYL 0 CTX 36 (SUTURE) ×2
SUT PDS AB 0 CTX 60 (SUTURE) IMPLANT
SUT PLAIN 2 0 XLH (SUTURE) IMPLANT
SUT VIC AB 0 CT1 27 (SUTURE) ×4
SUT VIC AB 0 CT1 27XBRD ANBCTR (SUTURE) ×2 IMPLANT
SUT VIC AB 2-0 CT1 27 (SUTURE) ×2
SUT VIC AB 2-0 CT1 TAPERPNT 27 (SUTURE) ×1 IMPLANT
TOWEL OR 17X24 6PK STRL BLUE (TOWEL DISPOSABLE) ×4 IMPLANT
TRAY FOLEY CATH 14FR (SET/KITS/TRAYS/PACK) ×2 IMPLANT
WATER STERILE IRR 1000ML POUR (IV SOLUTION) ×2 IMPLANT

## 2011-07-24 NOTE — Op Note (Signed)
07/24/2011  2:45 PM  PATIENT:  [name]  30 y.o. female  PRE-OPERATIVE DIAGNOSIS:  repeat in labor, nonreassuring FHTs, multiparous desires sterility  POST-OPERATIVE DIAGNOSIS:  repeat in labor, same  PROCEDURE:  Procedure(s): CESAREAN SECTION, BTL  SURGEON:  Surgeon(s): Lakshya Mcgillicuddy A Carlie Solorzano  ASSISTANTS: none   ANESTHESIA:   spinal  EBL:  Total I/O In: 2500 [I.V.:2500] Out: 800 [Urine:100; Blood:700]  SPECIMEN:  No Specimen  DISPOSITION OF SPECIMEN:  N/A  COUNTS:  YES  DICTATION: see below  PLAN OF CARE: Admit to inpatient   PATIENT DISPOSITION:  PACU - hemodynamically stable.   Delay start of Pharmacological VTE agent (>24hrs) due to surgical blood loss or risk of bleeding:  NOT APPLICABLE:20182  Complications:  none Medications:  Ancef, Pitocin Findings:  Baby female, Apgars 8,9, weight P.  Cord Ph 7.26. Normal tubes, ovaries and uterus seen.  Technique:  After adequate spinal anesthesia was achieved, the patient was prepped and draped in usual sterile fashion.  A foley catheter was used to drain the bladder.  A pfannanstiel incision was made with the scalpel and carried down to the fascia with the bovie cautery. The fascia was incised in the midline with the scalpel and carried in a transverse curvilinear manner bilaterally.  The fascia was reflected superiorly and inferiorly off the rectus muscles and the muscles split in the midline.  A bowel free portion of the peritoneum was entered bluntly and then extended in a superior and inferior manner with good visualization of the bowel and bladder.  The Alexis instrument was then placed and the vesico-uterine fascia tented up and incised in a transverse curvilinear manner.  A 2 cm incision was made in the upper portion of the lower uterine segment until the amnion was exposed.  Thick meconium fluid was noted and the baby delivered in the vertex presentation without complication.  The baby was bulb suctioned and the cord  was clamped and cut.  The baby was then handed to awaiting Neonatology.  The placenta was then delivered manually and the uterus cleared of all debris.  The uterine incision was then closed with a running lock stitch of 0 monocryl.  An imbricating layer of 0 monocryl was closed as well. Good hemostasis of the uterine incision was achieved, the abdomen was cleared with irrigation and the peritoneum was closed with a running stitch of 2-0 vicryl.  This incorporated the rectus muscles as a separate layer.  The fascia was then closed with a running stitch of 0 vicryl.  The subcutaneous layer was closed with interrupted  stitches of 2-0 plain gut.  The skin was closed with staples.  The patient tolerated the procedure well and was returned to the recovery room in stable condition.  All counts were correct times three.  Breely Panik A

## 2011-07-24 NOTE — Progress Notes (Signed)
Pt sent from MD office for labor eval, C/S scheduled for tomorrow, cervix 0.5cm in office today, denies bleeding or lof. +FM.

## 2011-07-24 NOTE — H&P (Signed)
30 y.o.  [redacted]w[redacted]d    [id number] scheduled  for a repeat cesarean section at term tomorrow presented to office c/o ctxes. Found to have subtle late decels and moderate variable decels. Pt has been seen multiple times of late for increased blood pressures without s/s preeclampsia.  Patient has good fetal movement and no bleeding. No c/o HA.  Past Medical History  Diagnosis Date  . Hypertension     prior to Pregnant, borderline but no meds  . Cold     sx 3 weeks ago - no meds  . Cough     sx x 3 weeks ago, no meds  . Headache      HA - tx w/otc med prn    Past Surgical History  Procedure Date  . Cesarean section 12/2003, 07/2007    x2    OB History    Grav Para Term Preterm Abortions TAB SAB Ect Mult Living   3 2 2  0 0 0 0 0 0 2     # Outc Date GA Lbr Len/2nd Wgt Sex Del Anes PTL Lv   1 TRM            2 TRM            3 CUR               History   Social History  . Marital Status: Married    Spouse Name: N/A    Number of Children: N/A  . Years of Education: N/A   Occupational History  . Not on file.   Social History Main Topics  . Smoking status: Never Smoker   . Smokeless tobacco: Never Used  . Alcohol Use: No  . Drug Use: No  . Sexually Active: Yes    Birth Control/ Protection: None     pregnant   Other Topics Concern  . Not on file   Social History Narrative  . No narrative on file   Review of patient's allergies indicates no known allergies.   Prenatal Course: uncomplicated until increased blood pressures of late.    Filed Vitals:   07/24/11 1257  BP: 136/79  Pulse: 111  Temp:   Resp:      Lungs/Cor:  NAD Abdomen:  soft, gravid Ex:  no cords, erythema SVE:  NA FHTs:  140s, gstv and nst R now; some subtle lates and moderate variables. Toco continued ctxes q3.  A/P   For repeat cesarean sectionat term, today for nonreassuring FHTs with persistent contractions; pt also desires BTl.  All risks, benefits and alternatives discussed with patient and she  desires to proceed.  Haleem Hanner A

## 2011-07-24 NOTE — Anesthesia Procedure Notes (Addendum)

## 2011-07-24 NOTE — Preoperative (Signed)
Beta Blockers   Reason not to administer Beta Blockers:Not Applicable 

## 2011-07-24 NOTE — Brief Op Note (Signed)
07/24/2011  2:45 PM  PATIENT:  [name]  30 y.o. female  PRE-OPERATIVE DIAGNOSIS:  repeat in labor, nonreassuring FHTs, multiparous desires sterility  POST-OPERATIVE DIAGNOSIS:  repeat in labor, same  PROCEDURE:  Procedure(s): CESAREAN SECTION, BTL  SURGEON:  Surgeon(s): Said Rueb [name] Amoura Ransier  ASSISTANTS: none   ANESTHESIA:   spinal  EBL:  Total I/O In: 2500 [I.V.:2500] Out: 800 [Urine:100; Blood:700]  SPECIMEN:  No Specimen  DISPOSITION OF SPECIMEN:  N/[name]  COUNTS:  YES  DICTATION: see below  PLAN OF CARE: Admit to inpatient   PATIENT DISPOSITION:  PACU - hemodynamically stable.   Delay start of Pharmacological VTE agent (>24hrs) due to surgical blood loss or risk of bleeding:  NOT APPLICABLE:20182  Complications:  none Medications:  Ancef, Pitocin Findings:  Baby female, Apgars 8,9, weight P.  Cord Ph 7.26. Normal tubes, ovaries and uterus seen.  Technique:  After adequate spinal anesthesia was achieved, the patient was prepped and draped in usual sterile fashion.  [name] foley catheter was used to drain the bladder.  [name] pfannanstiel incision was made with the scalpel and carried down to the fascia with the bovie cautery. The fascia was incised in the midline with the scalpel and carried in [name] transverse curvilinear manner bilaterally.  The fascia was reflected superiorly and inferiorly off the rectus muscles and the muscles split in the midline.  [name] bowel free portion of the peritoneum was entered bluntly and then extended in [name] superior and inferior manner with good visualization of the bowel and bladder.  The Alexis instrument was then placed and the vesico-uterine fascia tented up and incised in [name] transverse curvilinear manner.  [name] 2 cm incision was made in the upper portion of the lower uterine segment until the amnion was exposed.  Thick meconium fluid was noted and the baby delivered in the vertex presentation without complication.  The baby was bulb suctioned and the cord  was clamped and cut.  The baby was then handed to awaiting Neonatology.  The placenta was then delivered manually and the uterus cleared of all debris.  The uterine incision was then closed with [name] running lock stitch of 0 monocryl.  An imbricating layer of 0 monocryl was closed as well. Good hemostasis of the uterine incision was achieved, the abdomen was cleared with irrigation and the peritoneum was closed with [name] running stitch of 2-0 vicryl.  This incorporated the rectus muscles as [name] separate layer.  The fascia was then closed with [name] running stitch of 0 vicryl.  The subcutaneous layer was closed with interrupted  stitches of 2-0 plain gut.  The skin was closed with staples.  The patient tolerated the procedure well and was returned to the recovery room in stable condition.  All counts were correct times three.  [name]  

## 2011-07-24 NOTE — Progress Notes (Signed)
Patient was seen at the office by Dr. Henderson Cloud. Was told she was to come to MAu to get medication to stop contractions.

## 2011-07-24 NOTE — Anesthesia Preprocedure Evaluation (Signed)
Anesthesia Evaluation  Patient identified by MRN, date of birth, ID band Patient awake    Reviewed: Allergy & Precautions, H&P , Patient's Chart, lab work & pertinent test results  Airway Mallampati: III TM Distance: >3 FB Neck ROM: full    Dental No notable dental hx.    Pulmonary  clear to auscultation  Pulmonary exam normal       Cardiovascular Exercise Tolerance: Good hypertension (no meds, DBP today 90's), + Valvular Problems/Murmurs regular Normal    Neuro/Psych    GI/Hepatic   Endo/Other  Morbid obesity  Renal/GU      Musculoskeletal   Abdominal   Peds  Hematology   Anesthesia Other Findings   Reproductive/Obstetrics                           Anesthesia Physical Anesthesia Plan  ASA: III  Anesthesia Plan: Spinal   Post-op Pain Management:    Induction:   Airway Management Planned:   Additional Equipment:   Intra-op Plan:   Post-operative Plan:   Informed Consent: I have reviewed the patients History and Physical, chart, labs and discussed the procedure including the risks, benefits and alternatives for the proposed anesthesia with the patient or authorized representative who has indicated his/her understanding and acceptance.   Dental Advisory Given  Plan Discussed with: CRNA  Anesthesia Plan Comments: (Lab work confirmed with CRNA in room. Platelets okay. Discussed spinal anesthetic, and patient consents to the procedure:  included risk of possible headache,backache, failed block, allergic reaction, and nerve injury. This patient was asked if she had any questions or concerns before the procedure started. )        Anesthesia Quick Evaluation

## 2011-07-24 NOTE — Consult Note (Signed)
Neonatology Note:  Attendance at C-section:  I was asked to attend this repeat C/S at term, originally scheduled for tomorrow, but having subtle FHR decelerations today. The mother is a G3P2. ROM at delivery, fluid deeply meconium-stained. Infant vigorous with good spontaneous cry and tone. Needed bulb suctioning for moderate green fluid from throat and nares, but baby was crying vigorously prior to suctioning. Ap 9/9. Lungs with minimal rales to ausc in DR, no distress. Also noted tiny pigmented nevus on right flank. To CN to care of Pediatrician.  Noella Kipnis, MD  

## 2011-07-24 NOTE — Addendum Note (Signed)
Addendum  created 07/24/11 1807 by [place] Carm[place] L. Rodman Pickle, MD   Modules edited:Orders, PRL Based Order Sets

## 2011-07-24 NOTE — Anesthesia Postprocedure Evaluation (Signed)
Anesthesia Post Note  Patient: [name]  Procedure(s) Performed:  CESAREAN SECTION  Anesthesia type: Spinal  Patient location: PACU  Post pain: Pain level controlled  Post assessment: Post-op Vital signs reviewed  Last Vitals:  Filed Vitals:   07/24/11 1530  BP: 137/83  Pulse: 101  Temp:   Resp:     Post vital signs: Reviewed  Level of consciousness: awake  Complications: No apparent anesthesia complications

## 2011-07-24 NOTE — Transfer of Care (Signed)
Immediate Anesthesia Transfer of Care Note  Patient: [name]  Procedure(s) Performed:  CESAREAN SECTION  Patient Location: PACU  Anesthesia Type: Spinal  Level of Consciousness: awake, alert  and oriented  Airway & Oxygen Therapy: Patient Spontanous Breathing  Post-op Assessment: Report given to PACU RN  Post vital signs: Reviewed  Complications: No apparent anesthesia complications

## 2011-07-25 ENCOUNTER — Inpatient Hospital Stay (HOSPITAL_COMMUNITY)
Admission: RE | Admit: 2011-07-25 | Payer: BC Managed Care – PPO | Source: Ambulatory Visit | Admitting: Obstetrics and Gynecology

## 2011-07-25 ENCOUNTER — Encounter (HOSPITAL_COMMUNITY)
Admission: AD | Disposition: A | Discharge status: Home / Self Care | Payer: Self-pay | Source: Ambulatory Visit | Attending: Obstetrics and Gynecology

## 2011-07-25 ENCOUNTER — Encounter (HOSPITAL_COMMUNITY): Payer: Self-pay | Admitting: Obstetrics and Gynecology

## 2011-07-25 LAB — CBC
Hemoglobin: 10.7 g/dL — ABNORMAL LOW (ref 12.0–15.0)
MCH: 23.6 pg — ABNORMAL LOW (ref 26.0–34.0)
Platelets: 211 10*3/uL (ref 150–400)
RBC: 4.54 MIL/uL (ref 3.87–5.11)
WBC: 8.3 10*3/uL (ref 4.0–10.5)

## 2011-07-25 SURGERY — Surgical Case
Anesthesia: Regional

## 2011-07-25 MED ORDER — PSEUDOEPHEDRINE HCL 30 MG PO TABS
60.0000 mg | ORAL_TABLET | Freq: Three times a day (TID) | ORAL | Status: DC | PRN
  Administered 2011-07-25 – 2011-07-26 (×2): 60 mg via ORAL
  Filled 2011-07-25 (×2): qty 2

## 2011-07-25 MED ORDER — CHLORHEXIDINE GLUCONATE CLOTH 2 % EX PADS
6.0000 | MEDICATED_PAD | Freq: Every day | CUTANEOUS | Status: DC
  Administered 2011-07-26: 6 via TOPICAL

## 2011-07-25 MED ORDER — MUPIROCIN CALCIUM 2 % EX CREA
TOPICAL_CREAM | Freq: Two times a day (BID) | CUTANEOUS | Status: DC
  Administered 2011-07-25 – 2011-07-27 (×4): via TOPICAL
  Filled 2011-07-25: qty 15

## 2011-07-25 NOTE — Progress Notes (Addendum)
POD#1 Pt without c/o. Tolerating diet Lochia-wnl,  Pt is tachycardic, was prior to delivery, asymptomatic. IMP/ stable Plan/ 1) Follow pulse.

## 2011-07-25 NOTE — Anesthesia Postprocedure Evaluation (Signed)
  Anesthesia Post-op Note  Patient: [name]  Procedure(s) Performed:  CESAREAN SECTION  Patient Location: Mother/Baby  Anesthesia Type: Spinal  Level of Consciousness: alert  and oriented  Airway and Oxygen Therapy: Patient Spontanous Breathing  Post-op Pain: mild  Post-op Assessment: Patient's Cardiovascular Status Stable and Respiratory Function Stable  Post-op Vital Signs: stable  Complications: No apparent anesthesia complications

## 2011-07-25 NOTE — Addendum Note (Signed)
Addendum  created 07/25/11 0825 by Fanny Dance   Modules edited:Notes Section

## 2011-07-26 MED ORDER — GUAIFENESIN ER 600 MG PO TB12
600.0000 mg | ORAL_TABLET | Freq: Two times a day (BID) | ORAL | Status: DC
  Administered 2011-07-26 – 2011-07-27 (×2): 600 mg via ORAL
  Filled 2011-07-26 (×2): qty 1

## 2011-07-26 NOTE — Progress Notes (Signed)
Mother and baby doing well this AM.  Mom's VS stable, including BP.  + flatus.  Breast feeding.  CBC 12/19  10.7/8.3 with 211 K platelets.  Showered yesterday.  Likely discharge tomorrow.  Baby circ'ed 12/19.

## 2011-07-27 MED ORDER — OXYCODONE-ACETAMINOPHEN 5-325 MG PO TABS: 1.0000 | ORAL_TABLET | ORAL | Status: AC | PRN

## 2011-07-27 NOTE — Progress Notes (Signed)
  Patient is eating, ambulating, voiding.  Pain control is good.  Filed Vitals:   07/26/11 1331 07/26/11 1500 07/26/11 2102 07/27/11 0730  BP: 149/90 131/88 141/92 134/86  Pulse: 105 100 99 102  Temp: 98.2 F (36.8 C)  98.2 F (36.8 C)   TempSrc: Oral  Oral   Resp: 20  20 18   Height:      Weight:      SpO2:        lungs:   clear to auscultation cor:    RRR Abdomen:  soft, appropriate tenderness, incisions intact and without erythema or exudate ex:    no cords   Lab Results  Component Value Date   WBC 8.3 07/25/2011   HGB 10.7* 07/25/2011   HCT 34.1* 07/25/2011   MCV 75.1* 07/25/2011   PLT 211 07/25/2011    --/--/O POS (12/18 1315)/RI  A/P    Post operative day 3.  Routine post op and postpartum care.  Expect d/c today.  Percocet for pain control.  BPs stable on no medications.

## 2011-07-27 NOTE — Discharge Summary (Signed)
Obstetric Discharge Summary Reason for Admission: onset of labor, Nonreasuring FHTs, repeat cesarean Prenatal Procedures: NST Intrapartum Procedures: cesarean: low cervical, transverse, BTL Postpartum Procedures: none Complications-Operative and Postpartum: none Hemoglobin  Date Value Range Status  07/25/2011 10.7* 12.0-15.0 (g/dL) Final     HCT  Date Value Range Status  07/25/2011 34.1* 36.0-46.0 (%) Final   Hospital Course:  See H&P for details; briefly, admitted for emergent repeat c/s one day before scheduled c/s secondary labor and nonreassuring fhts.  Pt underwent uncomplicated surgery and BTL.  Postoperatively, pt recovered well without complication.  Pt had been having PIH prior to admission but BPs now are stable off medications.  Pt to return for BP check in office next week.  Discharge Diagnoses: Term Pregnancy-delivered  Discharge Information: Date: 07/27/2011 Activity: pelvic rest Diet: routine Medications: Ibuprofen Condition: stable Instructions: refer to practice specific booklet Discharge to: home Follow-up Information    Follow up with Caprice Beaver. Make an appointment in 1 week.   Contact information:   [address] [place] [address] [phone]          Newborn Data: Live born female  Birth Weight: 5 lb 8.7 oz (2515 g) APGAR: 9, 9  Home with mother.  Aasia Peavler A 07/27/2011, 8:01 AM

## 2012-07-02 ENCOUNTER — Emergency Department (HOSPITAL_COMMUNITY)
Admission: EM | Admit: 2012-07-02 | Discharge: 2012-07-02 | Disposition: A | Discharge status: Home / Self Care | Payer: BC Managed Care – PPO | Source: Home / Self Care | Attending: Family Medicine | Admitting: Family Medicine

## 2012-07-02 ENCOUNTER — Encounter (HOSPITAL_COMMUNITY): Payer: Self-pay | Admitting: Emergency Medicine

## 2012-07-02 DIAGNOSIS — M62830 Muscle spasm of back: Secondary | ICD-10-CM

## 2012-07-02 DIAGNOSIS — I10 Essential (primary) hypertension: Secondary | ICD-10-CM

## 2012-07-02 DIAGNOSIS — IMO0001 Reserved for inherently not codable concepts without codable children: Secondary | ICD-10-CM

## 2012-07-02 DIAGNOSIS — M5380 Other specified dorsopathies, site unspecified: Secondary | ICD-10-CM

## 2012-07-02 DIAGNOSIS — M791 Myalgia, unspecified site: Secondary | ICD-10-CM

## 2012-07-02 LAB — POCT I-STAT, CHEM 8
Chloride: 106 mEq/L (ref 96–112)
Creatinine, Ser: 0.8 mg/dL (ref 0.50–1.10)
Glucose, Bld: 85 mg/dL (ref 70–99)
HCT: 38 % (ref 36.0–46.0)
Hemoglobin: 12.9 g/dL (ref 12.0–15.0)
Potassium: 3.8 mEq/L (ref 3.5–5.1)
Sodium: 141 mEq/L (ref 135–145)

## 2012-07-02 MED ORDER — CYCLOBENZAPRINE HCL 10 MG PO TABS: 10.0000 mg | ORAL_TABLET | Freq: Two times a day (BID) | ORAL | Status: DC | PRN

## 2012-07-02 MED ORDER — TRIAMTERENE-HCTZ 37.5-25 MG PO TABS: ORAL_TABLET | ORAL | Status: AC

## 2012-07-02 NOTE — ED Notes (Signed)
Reports muscle pain on left side of body with breast pain.  Patient was breastfeeding three months ago and is still having leakage.  Reports tingling on left side of body when having muscle pain.

## 2012-07-05 NOTE — ED Provider Notes (Signed)
History     CSN: [id number]  Arrival date & time 07/02/12  0830   First MD Initiated Contact with Patient 07/02/12 [phone]      Chief Complaint  Patient presents with  . Muscle Pain    (Consider location/radiation/quality/duration/timing/severity/associated sxs/prior treatment) HPI Comments: 31 y/o female h/o pre- gestational and gestational HTN. With no current HTN treatment. Here c/o "twiching type of pain in left side of her body" for months more frequent during last week. When asked to localize pain points to left shoulder and area below left clavicle, also to left arm and left lower back and hip. Patient is 11 month post partum and states she carries her baby mostly on left side. States pain sometimes involves her breast she stopped breast feeding 3 months ago and has sporadic breast milk leaks. Denies bloody nipple discharge. No redness or swelling in breasts currently. She also reports intermittent headaches, but denies current headache, chest pain or blurry vision also denies extremity weakness or numbness. No balance or gate problems. No abdominal pain or hematuria. No leg swelling, cough, SOB or PND.    Past Medical History  Diagnosis Date  . Hypertension     prior to Pregnant, borderline but no meds  . Cold     sx 3 weeks ago - no meds  . Cough     sx x 3 weeks ago, no meds  . Headache      [facility] - tx w/otc med prn    Past Surgical History  Procedure Date  . Cesarean section 12/2003, 07/2007    x2  . Cesarean section 07/24/2011    Procedure: CESAREAN SECTION;  Surgeon: Loney Laurence;  Location: [facility];  Service: Gynecology;  Laterality: N/A;    Family History  Problem Relation Age of Onset  . Hypertension Mother   . Diabetes Mother   . Hypertension Father   . Hypertension Sister   . Anesthesia problems Neg Hx     History  Substance Use Topics  . Smoking status: Never Smoker   . Smokeless tobacco: Never Used  . Alcohol Use: No    OB History    Grav Para  Term Preterm Abortions TAB SAB Ect Mult Living   3 3 3  0 0 0 0 0 0 3      Review of Systems  Constitutional: Negative for fever, chills, diaphoresis, appetite change and fatigue.  Respiratory: Negative for cough and shortness of breath.   Cardiovascular: Negative for chest pain and leg swelling.  Gastrointestinal: Negative for nausea, vomiting and abdominal pain.  Genitourinary: Negative for dysuria, frequency and hematuria.  Musculoskeletal: Positive for myalgias.  Skin: Negative for rash.  Neurological: Positive for headaches. Negative for dizziness, tremors, seizures, syncope, weakness and numbness.  All other systems reviewed and are negative.    Allergies  Review of patient's allergies indicates no known allergies.  Home Medications   Current Outpatient Rx  Name  Route  Sig  Dispense  Refill  . CALCIUM CARBONATE ANTACID 500 MG PO CHEW   Oral   Chew 1 tablet by mouth daily as needed. For heartburn.          . CYCLOBENZAPRINE HCL 10 MG PO TABS   Oral   Take 1 tablet (10 mg total) by mouth 2 (two) times daily as needed for muscle spasms.   20 tablet   0   . HYDROCORTISONE 1 % EX CREA   Topical   Apply 1 application topically 2 (two)  times daily. For yeast infection.          Marland Kitchen PRENATAL PLUS 27-1 MG PO TABS   Oral   Take 1 tablet by mouth daily.           . TRIAMTERENE-HCTZ 37.5-25 MG PO TABS      Take 1/2 tablets daily in am   30 tablet   0     BP 153/106  Pulse 99  Temp 98.6 F (37 C) (Oral)  Resp 18  SpO2 97%  Breastfeeding? Unknown  Physical Exam  Nursing note and vitals reviewed. Constitutional: She is oriented to person, place, and time. She appears well-developed and well-nourished. No distress.       Appears comfortable  HENT:  Head: Normocephalic and atraumatic.  Eyes: EOM are normal. Pupils are equal, round, and reactive to light.  Neck: Neck supple. No JVD present. No thyromegaly present.  Cardiovascular: Normal rate, regular rhythm  and normal heart sounds.  Exam reveals no gallop and no friction rub.   No murmur heard. Pulmonary/Chest: Effort normal and breath sounds normal.       Tenderness with deep palpation in left subclavicular area and left upper parasternal area.  Breast symmetric no tenderness no masses, no retractions or lumps. No nipple discharge no skin changes.   Abdominal: Soft. Bowel sounds are normal. She exhibits no distension and no mass. There is no tenderness.       No CVT No bruits.  Musculoskeletal: Normal range of motion. She exhibits tenderness. She exhibits no edema.       Left shoulder FROM. Normal exam.  Back: Central spine with no scoliosis or kyphosis. Fair anterior flexion and posterior extension. Patient able to walk on tip toes and heels with no difficulty foot drop or pain exacerbation. No bone prominence tenderness. Tenderness to palpation in left lumbar paravertebral muscles.  Negative straight leg test bilateral. Intact sensation and symmetric + DTRs (rotullian and achillean) in low extremities. Left hip: FROM. Normal exam.    Lymphadenopathy:    She has no cervical adenopathy.  Neurological: She is alert and oriented to person, place, and time.  Skin: No rash noted. She is not diaphoretic.  Psychiatric: She has a normal mood and affect. Her behavior is normal. Judgment and thought content normal.    ED Course  Procedures (including critical care time)   Labs Reviewed  POCT I-STAT, CHEM 8  LAB REPORT - SCANNED   No results found.   1. Hypertension   2. Back muscle spasm   3. Muscle pain     EKG: NSR. Ventricular rate 91. No ischemic changes. No signs of hypertrophy.    MDM  Patient hypertensive here 153/ 106 persistent on repeat. Asymptomatic. Normal EKG. Normal creatinine and electrolytes. Prior documented h/o HTN.  Decided to start patient on maxzide 37.5/25mg  1/2 tab daily. Asked to return or follow up with a PCP in 2 weeks to recheck her BP. Prescribed flexeril  for muscle pain/spasms.       Sharin Grave, MD 07/05/12 2139

## 2013-10-20 ENCOUNTER — Other Ambulatory Visit: Payer: Self-pay | Admitting: Obstetrics and Gynecology

## 2013-12-30 ENCOUNTER — Other Ambulatory Visit: Payer: Self-pay | Admitting: Obstetrics and Gynecology

## 2014-06-07 ENCOUNTER — Encounter (HOSPITAL_COMMUNITY): Payer: Self-pay | Admitting: Emergency Medicine

## 2014-11-18 ENCOUNTER — Other Ambulatory Visit: Payer: Self-pay | Admitting: Obstetrics and Gynecology

## 2014-11-19 LAB — CYTOLOGY - PAP

## 2016-12-25 ENCOUNTER — Telehealth: Payer: Self-pay | Admitting: Cardiovascular Disease

## 2016-12-25 NOTE — Telephone Encounter (Signed)
[facility] 01[facility]09/17 with Dr Allyson SabalBerry.  Records put with Dr Hazle CocaBerry's schedule for 01/09/17. lp

## 2017-01-09 ENCOUNTER — Ambulatory Visit: Payer: Self-pay | Admitting: Cardiovascular Disease

## 2018-08-06 DIAGNOSIS — N946 Dysmenorrhea, unspecified: Secondary | ICD-10-CM

## 2018-08-06 HISTORY — DX: Dysmenorrhea, unspecified: N94.6

## 2019-11-12 ENCOUNTER — Ambulatory Visit: Payer: Self-pay | Attending: Internal Medicine

## 2019-11-12 DIAGNOSIS — Z23 Encounter for immunization: Secondary | ICD-10-CM

## 2019-11-12 NOTE — Progress Notes (Signed)
   Covid-19 Vaccination Clinic  Name:  [name]    MRN: [id number] DOB: 05-15-1981  11/12/2019  [name] was observed post Covid-19 immunization for 30 minutes based on pre-vaccination screening without incident. She was provided with Vaccine Information Sheet and instruction to access the V-Safe system.   [name] was instructed to call 911 with any severe reactions post vaccine: Marland Kitchen Difficulty breathing  . Swelling of face and throat  . A fast heartbeat  . A bad rash all over body  . Dizziness and weakness   Immunizations Administered    Name Date Dose VIS Date Route   Pfizer COVID-19 Vaccine 11/12/2019  8:37 AM 0.3 mL 07/17/2019 Intramuscular   Manufacturer: [employer], [employer]   Lot: [id number]   NDC: [address]-7

## 2019-12-07 ENCOUNTER — Ambulatory Visit: Payer: Self-pay | Attending: Internal Medicine

## 2019-12-07 DIAGNOSIS — Z23 Encounter for immunization: Secondary | ICD-10-CM

## 2019-12-07 NOTE — Progress Notes (Signed)
   Covid-19 Vaccination Clinic  Name:  [name]    MRN: [id number] DOB: Dec 10, 1980  12/07/2019  [name] was observed post Covid-19 immunization for 30 minutes based on pre-vaccination screening without incident. She was provided with Vaccine Information Sheet and instruction to access the V-Safe system.   [name] was instructed to call 911 with any severe reactions post vaccine: Marland Kitchen Difficulty breathing  . Swelling of face and throat  . A fast heartbeat  . A bad rash all over body  . Dizziness and weakness   Immunizations Administered    Name Date Dose VIS Date Route   Pfizer COVID-19 Vaccine 12/07/2019  8:17 AM 0.3 mL 09/30/2018 Intramuscular   Manufacturer: [employer], [employer]   Lot: [id number]   NDC: [address]-5

## 2020-01-12 ENCOUNTER — Other Ambulatory Visit: Payer: Self-pay | Admitting: Physician Assistant

## 2020-09-14 DIAGNOSIS — R7303 Prediabetes: Secondary | ICD-10-CM

## 2020-09-14 HISTORY — DX: Prediabetes: R73.03

## 2020-12-20 DIAGNOSIS — U071 COVID-19: Secondary | ICD-10-CM

## 2020-12-20 HISTORY — DX: COVID-19: U07.1

## 2021-01-12 ENCOUNTER — Other Ambulatory Visit: Payer: Self-pay | Admitting: Dermatology

## 2021-07-06 ENCOUNTER — Other Ambulatory Visit: Payer: Self-pay | Admitting: Obstetrics and Gynecology

## 2021-07-06 DIAGNOSIS — Z1231 Encounter for screening mammogram for malignant neoplasm of breast: Secondary | ICD-10-CM

## 2021-07-26 DIAGNOSIS — E785 Hyperlipidemia, unspecified: Secondary | ICD-10-CM

## 2021-07-26 HISTORY — DX: Hyperlipidemia, unspecified: E78.5

## 2021-08-14 ENCOUNTER — Ambulatory Visit
Admission: RE | Admit: 2021-08-14 | Discharge: 2021-08-14 | Disposition: A | Discharge status: Home / Self Care | Payer: BC Managed Care – PPO | Source: Ambulatory Visit | Attending: Obstetrics and Gynecology | Admitting: Obstetrics and Gynecology

## 2021-08-14 DIAGNOSIS — Z1231 Encounter for screening mammogram for malignant neoplasm of breast: Secondary | ICD-10-CM

## 2021-10-17 ENCOUNTER — Encounter (HOSPITAL_BASED_OUTPATIENT_CLINIC_OR_DEPARTMENT_OTHER): Payer: Self-pay | Admitting: Obstetrics and Gynecology

## 2021-10-18 ENCOUNTER — Encounter (HOSPITAL_BASED_OUTPATIENT_CLINIC_OR_DEPARTMENT_OTHER): Payer: Self-pay | Admitting: Obstetrics and Gynecology

## 2021-10-18 ENCOUNTER — Other Ambulatory Visit: Payer: Self-pay

## 2021-10-18 DIAGNOSIS — Z01818 Encounter for other preprocedural examination: Secondary | ICD-10-CM | POA: Diagnosis present

## 2021-10-18 NOTE — Progress Notes (Signed)
?PLEASE WEAR A MASK OUT IN PUBLIC AND SOCIAL DISTANCE AND WASH YOUR HANDS FREQUENTLY. PLEASE ASK ALL YOUR CLOSE HOUSEHOLD CONTACT TO WEAR MASK OUT IN PUBLIC AND SOCIAL DISTANCE AND WASH HANDS FREQUENTLY ALSO. ? ? ? ? ? Your procedure is scheduled on Wednesday, 10/25/21. ? [facility] AT 10:45 am. ? ? Call this number if you have problems the morning of surgery  [phone]. ? ? OUR ADDRESS IS [address].  WE ARE LOCATED IN [facility]. ? ?PLEASE BRING YOUR INSURANCE CARD AND PHOTO ID DAY OF SURGERY. ? ?ONLY ONE PERSON ALLOWED IN FACILITY WAITING AREA. ?                                   ? REMEMBER: ? DO NOT EAT FOOD, CANDY GUM OR MINTS  AFTER MIDNIGHT THE NIGHT BEFORE YOUR SURGERY . YOU MAY HAVE CLEAR LIQUIDS FROM MIDNIGHT THE NIGHT BEFORE YOUR SURGERY UNTIL  9:45 am. NO CLEAR LIQUIDS AFTER   9:45 am DAY OF SURGERY. ? ? YOU MAY  BRUSH YOUR TEETH MORNING OF SURGERY AND RINSE YOUR MOUTH OUT, NO CHEWING GUM CANDY OR MINTS. ? ? ? ?CLEAR LIQUID DIET ? ? ?Foods Allowed                                                                     Foods Excluded ? ?Coffee and tea, regular and decaf                             liquids that you cannot  ?Plain Jell-O any favor except red or purple                                           see through such as: ?Fruit ices (not with fruit pulp)                                     milk, soups, orange juice  ?Iced Popsicles                                    All solid food ?Carbonated beverages, regular and diet                                    ?Cranberry, grape and apple juices ?Sports drinks like Gatorade ? ?Sample Menu ?Breakfast                                Lunch  Supper ?Cranberry juice                                           ?Jell-O                                     Grape juice                           Apple juice ?Coffee or tea                        Jell-O                                       Popsicle ?                                               Coffee or tea                        Coffee or tea ? ?_____________________________________________________________________ ?  ? ? TAKE THESE MEDICATIONS MORNING OF SURGERY WITH A SIP OF WATER:  Topiramate ? ?ONE VISITOR IS ALLOWED IN WAITING ROOM ONLY DAY OF SURGERY.  YOU MAY HAVE ANOTHER PERSON SWITCH OUT WITH THE  1  VISITOR IN THE WAITING ROOM DAY OF SURGERY AND A MASK MUST BE WORN IN THE WAITING ROOM.  ? ? 2 VISITORS  MAY VISIT IN THE EXTENDED RECOVERY ROOM UNTIL 800 PM ONLY 1 VISITOR AGE 42 AND OVER MAY SPEND THE NIGHT AND MUST BE IN EXTENDED RECOVERY ROOM NO LATER THAN 800 PM .  ? ? UP TO 2 CHILDREN AGE 76 TO 15 MAY ALSO VISIT IN EXTENDED RECOV ?ERY ROOM ONLY UNTIL 800 PM AND MUST LEAVE BY 800 PM.  ? ?ALL PERSONS VISITING IN EXTENDED RECOVERY ROOM MUST WEAR A MASK. ?                                   ?DO NOT WEAR JEWERLY, MAKE UP. ?DO NOT WEAR LOTIONS, POWDERS, PERFUMES OR NAIL POLISH ON YOUR FINGERNAILS. TOENAIL POLISH IS OK TO WEAR. ?DO NOT SHAVE FOR 48 HOURS PRIOR TO DAY OF SURGERY. ?MEN MAY SHAVE FACE AND NECK. ?CONTACTS, GLASSES, OR DENTURES MAY NOT BE WORN TO SURGERY. ?                                   ?[facility] IS NOT RESPONSIBLE  FOR ANY BELONGINGS.                                  ?                                  . ?  [place] - Preparing for Surgery ?Before surgery, you can play an important role.  Because skin is not sterile, your skin needs to be as free of germs as possible.  You can reduce the number of germs on your skin by washing with CHG (chlorahexidine gluconate) soap before surgery.  CHG is an antiseptic cleaner which kills germs and bonds with the skin to continue killing germs even after washing. ?Please DO NOT use if you have an allergy to CHG or antibacterial soaps.  If your skin becomes reddened/irritated stop using the CHG and inform your nurse when you arrive at Short Stay. ?Do not shave (including  legs and underarms) for at least 48 hours prior to the first CHG shower.  You may shave your face/neck. ?Please follow these instructions carefully: ? 1.  Shower with CHG Soap the night before surgery and the  morning of Surgery. ? 2.  If you choose to wash your hair, wash your hair first as usual with your  normal  shampoo. ? 3.  After you shampoo, rinse your hair and body thoroughly to remove the  shampoo.                            ?4.  Use CHG as you would any other liquid soap.  You can apply chg directly  to the skin and wash  ?                    Gently with a scrungie or clean washcloth. ? 5.  Apply the CHG Soap to your body ONLY FROM THE NECK DOWN.   Do not use on face/ open      ?                     Wound or open sores. Avoid contact with eyes, ears mouth and genitals (private parts).  ?                     Production manager,  Genitals (private parts) with your normal soap. ?            6.  Wash thoroughly, paying special attention to the area where your surgery  will be performed. ? 7.  Thoroughly rinse your body with warm water from the neck down. ? 8.  DO NOT shower/wash with your normal soap after using and rinsing off  the CHG Soap. ?               9.  Pat yourself dry with a clean towel. ?           10.  Wear clean pajamas. ?           11.  Place clean sheets on your bed the night of your first shower and do not  sleep with pets. ?Day of Surgery : ?Do not apply any lotions/deodorants the morning of surgery.  Please wear clean clothes to the hospital/surgery center. ? ?IF YOU HAVE ANY SKIN IRRITATION OR PROBLEMS WITH THE SURGICAL SOAP, PLEASE GET A BAR OF GOLD DIAL SOAP AND SHOWER THE NIGHT BEFORE YOUR SURGERY AND THE MORNING OF YOUR SURGERY. PLEASE LET THE NURSE KNOW MORNING OF YOUR SURGERY IF YOU HAD ANY PROBLEMS WITH THE SURGICAL SOAP. ? ? ?________________________________________________________________________                  ?                                    ?  QUESTIONS CALL Shanora Christensen PRE OP NURSE  PHONE [phone].                                    ?

## 2021-10-18 NOTE — Progress Notes (Addendum)
Spoke w/ via phone for pre-op interview---Jkayla ?Lab needs dos----urine pregnancy POCT per anesthesia. Patient will need a repeat BMP on morning of surgery to check K+ per Dr. Philis Pique.  Patient had a critical lab value of K+ 2.6 on 10/23/21. Dr. Philis Pique was notified and started patient on K+.             ?Lab results------10/23/21 Lab appt for CBC, BMP, EKG, type & screen, 04/05/21 Cxray in Epic ?COVID test -----patient states asymptomatic no test needed ?Arrive at -------0930 on Wednesday, 10/25/21 ?NPO after MN NO Solid Food.  Clear liquids from MN until---0945 ?Med rec completed ?Medications to take morning of surgery -----Topiramate ?Diabetic medication -----Do not take Ozempic on the morning of surgery. ?Patient instructed no nail polish to be worn day of surgery ?Patient instructed to bring photo id and insurance card day of surgery ?Patient aware to have Driver (ride ) / caregiver    for 24 hours after surgery - husband, Ysidro Evert ?Patient Special Instructions -----Extended recovery instructions given. ?Pre-Op special Istructions -----none ?Patient verbalized understanding of instructions that were given at this phone interview. ?Patient denies shortness of breath, chest pain, fever, cough at this phone interview.  ? ?Per 10/24/21 phone call with Dr. Philis Pique, patient will need a repeat BMP to check K+ on morning of surgery. Patient is to come in at 0930 so she will have time to have a K+ infusion if necessary. ?

## 2021-10-23 ENCOUNTER — Encounter (HOSPITAL_COMMUNITY)
Admission: RE | Admit: 2021-10-23 | Discharge: 2021-10-23 | Disposition: A | Discharge status: Home / Self Care | Payer: BC Managed Care – PPO | Source: Ambulatory Visit | Attending: Obstetrics and Gynecology | Admitting: Obstetrics and Gynecology

## 2021-10-23 ENCOUNTER — Other Ambulatory Visit: Payer: Self-pay

## 2021-10-23 DIAGNOSIS — Z01818 Encounter for other preprocedural examination: Secondary | ICD-10-CM | POA: Insufficient documentation

## 2021-10-23 LAB — CBC
HCT: 42.1 % (ref 36.0–46.0)
Hemoglobin: 13.5 g/dL (ref 12.0–15.0)
MCH: 25.8 pg — ABNORMAL LOW (ref 26.0–34.0)
MCHC: 32.1 g/dL (ref 30.0–36.0)
MCV: 80.5 fL (ref 80.0–100.0)
Platelets: 382 10*3/uL (ref 150–400)
RBC: 5.23 MIL/uL — ABNORMAL HIGH (ref 3.87–5.11)
RDW: 13.5 % (ref 11.5–15.5)
WBC: 9.7 10*3/uL (ref 4.0–10.5)
nRBC: 0 % (ref 0.0–0.2)

## 2021-10-23 LAB — BASIC METABOLIC PANEL
Anion gap: 8 (ref 5–15)
BUN: 10 mg/dL (ref 6–20)
CO2: 29 mmol/L (ref 22–32)
Calcium: 8.9 mg/dL (ref 8.9–10.3)
Chloride: 100 mmol/L (ref 98–111)
Creatinine, Ser: 0.77 mg/dL (ref 0.44–1.00)
GFR, Estimated: >60 mL/min (ref >60)
Glucose, Bld: 86 mg/dL (ref 70–99)
Potassium: 2.6 mmol/L — CL (ref 3.5–5.1)
Sodium: 137 mmol/L (ref 135–145)

## 2021-10-23 NOTE — Progress Notes (Signed)
CRITICAL RESULT PROVIDER NOTIFICATION ? ?Test performed and critical result: bmet, potassium 2.6 ? ?Date and time result received: 950 am 10-23-2021 from tammy in lab ? ?Provider name/title: Cain Sieve RN ? ?Date and time provider notified:955 am 10-23-2021 dr Henderson Cloud notified potassium 2.6 ? ?Date and time provider responded:955 am spoke on dr Henderson Cloud cell number  ?Provider response:No new orders ? ? ? ?

## 2021-10-24 ENCOUNTER — Other Ambulatory Visit: Payer: Self-pay | Admitting: Obstetrics and Gynecology

## 2021-10-24 NOTE — H&P (Signed)
41 y.o. G3P3003 complains of dysmenorrhea, possible adenomyosis on [place]. ? ?Previously:"Physician interpretation: 9x6x5 cm; EM 6.67mm. Ovaries normal, L simple cyst at 2.5 cm. Some fluid in c/s scar without increase blood flow. Learta Codding. ? ?S/p Novasure. Does have some fluid in C/S scar. ? endometriosis, now no real bleeding but has significant pain. Horrible. Having to stay home 4 days straight with pain. ? ?At this point without firm endometriosis dx, would be difficult to get lupron. Offered hyst, pt desires. ? ?Ibuprofen did not help pain. Will do tramadol. " ? ? ?Past Medical History:  ?Diagnosis Date  ? Abnormal heart sounds   ? per 10/18/21 call with pt, this was when she was 300 lbs  ? Complication of anesthesia   ? COVID-19 12/20/2020  ? dizzy and weak for 1 day, no treatment  ? Dysmenorrhea 2020  ? Fluttering heart   ? Patient states this only happens if she can't sleep, per 10/18/21 phone call with patient.  ? Headache(784.0)   ?  [facility] - tx w/otc med prn  ? Hyperglycemia   ? Hyperlipidemia 07/26/2021  ? Total cholesterol = 230 on 07/26/21  ? Hypertension   ? PONV (postoperative nausea and vomiting)   ? Prediabetes 09/14/2020  ? A1C 5.7  ? Vitamin D deficiency   ? ?Past Surgical History:  ?Procedure Laterality Date  ? CESAREAN SECTION  12/2003, 07/2007  ? x2  ? CESAREAN SECTION  07/24/2011  ? Procedure: CESAREAN SECTION;  Surgeon: Daria Pastures;  Location: [facility];  Service: Gynecology;  Laterality: N/A;  ? NOVASURE ABLATION    ? around 2016 by Dr. Philis Pique  ? TUBAL LIGATION  2012  ? w/ C-section  ? WISDOM TOOTH EXTRACTION    ? around 2014  ?  ?Social History  ? ?Socioeconomic History  ? Marital status: Married  ?  Spouse name: Not on file  ? Number of children: Not on file  ? Years of education: Not on file  ? Highest education level: Not on file  ?Occupational History  ? Not on file  ?Tobacco Use  ? Smoking status: Never  ? Smokeless tobacco: Never  ?Vaping Use  ? Vaping Use: Never used  ?Substance and Sexual  Activity  ? Alcohol use: Yes  ?  Comment: about 1 glass per week  ? Drug use: No  ? Sexual activity: Yes  ?  Birth control/protection: Surgical  ?  Comment: pregnant  ?Other Topics Concern  ? Not on file  ?Social History Narrative  ? Not on file  ? ?Social Determinants of Health  ? ?Financial Resource Strain: Not on file  ?Food Insecurity: Not on file  ?Transportation Needs: Not on file  ?Physical Activity: Not on file  ?Stress: Not on file  ?Social Connections: Not on file  ?Intimate Partner Violence: Not on file  ? ? ?No current facility-administered medications on file prior to encounter.  ? ?Current Outpatient Medications on File Prior to Encounter  ?Medication Sig Dispense Refill  ? Semaglutide (OZEMPIC, 1 MG/DOSE, [place]) Inject into the skin once a week. For weight loss.    ? topiramate (TOPAMAX) 50 MG tablet Take 50 mg by mouth daily. For weight loss.    ? traMADol (ULTRAM) 50 MG tablet Take by mouth every 6 (six) hours as needed.    ? EPINEPHrine 0.3 mg/0.3 mL IJ SOAJ injection Inject 0.3 mg into the muscle once.    ? tretinoin (RETIN-A) 0.05 % cream APPLY TO ACNE AT BEDTIME (Patient taking  differently: daily as needed.) 20 g 6  ? triamterene-hydrochlorothiazide (MAXZIDE-25) 37.5-25 MG per tablet Take 1/2 tablets daily in am (Patient taking differently: Take 1 tablet daily in am) 30 tablet 0  ? ? ?Allergies  ?Allergen Reactions  ? Hydrocodone Nausea And Vomiting  ? Oxycodone Nausea And Vomiting  ? Percocet [Oxycodone-Acetaminophen] Nausea And Vomiting  ? Garlic Rash  ? Shellfish Allergy Swelling  ? ? ?Vitals:  ? 10/18/21 0955  ?Weight: 107 kg  ?Height: 5\' 5"  (1.651 m)  ? ? ?Lungs: clear to ascultation ?Cor:  RRR ?Abdomen:  soft, nontender, nondistended. ?Ex:  no cords, erythema ?Pelvic:   ?Vulva: no masses, no atrophy, no lesions ?Vagina: no tenderness, no erythema, no vesicle(s) or ulcers, no cystocele, no rectocele, abnormal vaginal discharge ?Cervix: grossly normal, no discharge, no cervical motion  tenderness ?Uterus: normal size (7), normal shape, midline, no uterine prolapse, non-tender ?Bladder/Urethra: normal meatus, no urethral discharge, no urethral mass, bladder non distended, Urethra well supported ?Adnexa/Parametria: no parametrial tenderness, no parametrial mass, no adnexal tenderness, no ovarian mass ? ? ?A:  For Robotic TLH/salpingectomies/cysto.  Even if endometriosis, will leave ovaries. ?Pt's K+ 2.6 at preop labs.  Pt started on K-dur 20 meq a day.  Will check K+ again on day of surgery and can consider IV K+ if needed.   ? ?P: All risks, benefits and alternatives d/w patient and she desires to proceed.  Patient has undergone a modified diet, ERAS protocol and will receive preop antibiotics and SCDs during the operation.   Pt to have extended recovery but will go home same day if eating, ambulating, voiding and pain control is good. ? ?Daria Pastures  ?

## 2021-10-24 NOTE — Progress Notes (Addendum)
I spoke with Candace, scheduler for Dr. Henderson Cloud on 10/24/21 concerning patient's critical lab value of K+ 2.6 on 10/23/21. Per Candace, Dr. Henderson Cloud notified patient and started on K+ supplement. Will need to recheck K+ on morning of surgery.  ?

## 2021-10-25 ENCOUNTER — Ambulatory Visit (HOSPITAL_BASED_OUTPATIENT_CLINIC_OR_DEPARTMENT_OTHER)
Admission: RE | Admit: 2021-10-25 | Discharge: 2021-10-25 | Disposition: A | Discharge status: Home / Self Care | Payer: BC Managed Care – PPO | Source: Ambulatory Visit | Attending: Obstetrics and Gynecology | Admitting: Obstetrics and Gynecology

## 2021-10-25 ENCOUNTER — Encounter (HOSPITAL_BASED_OUTPATIENT_CLINIC_OR_DEPARTMENT_OTHER)
Admission: RE | Disposition: A | Discharge status: Home / Self Care | Payer: Self-pay | Source: Ambulatory Visit | Attending: Obstetrics and Gynecology

## 2021-10-25 ENCOUNTER — Other Ambulatory Visit: Payer: Self-pay

## 2021-10-25 ENCOUNTER — Ambulatory Visit (HOSPITAL_BASED_OUTPATIENT_CLINIC_OR_DEPARTMENT_OTHER): Payer: BC Managed Care – PPO | Admitting: Anesthesiology

## 2021-10-25 ENCOUNTER — Encounter (HOSPITAL_BASED_OUTPATIENT_CLINIC_OR_DEPARTMENT_OTHER): Payer: Self-pay | Admitting: Obstetrics and Gynecology

## 2021-10-25 DIAGNOSIS — N838 Other noninflammatory disorders of ovary, fallopian tube and broad ligament: Secondary | ICD-10-CM | POA: Insufficient documentation

## 2021-10-25 DIAGNOSIS — Z79899 Other long term (current) drug therapy: Secondary | ICD-10-CM | POA: Diagnosis not present

## 2021-10-25 DIAGNOSIS — N7011 Chronic salpingitis: Secondary | ICD-10-CM | POA: Diagnosis not present

## 2021-10-25 DIAGNOSIS — I10 Essential (primary) hypertension: Secondary | ICD-10-CM | POA: Diagnosis not present

## 2021-10-25 DIAGNOSIS — N72 Inflammatory disease of cervix uteri: Secondary | ICD-10-CM | POA: Diagnosis not present

## 2021-10-25 DIAGNOSIS — E876 Hypokalemia: Secondary | ICD-10-CM | POA: Diagnosis not present

## 2021-10-25 DIAGNOSIS — N736 Female pelvic peritoneal adhesions (postinfective): Secondary | ICD-10-CM | POA: Diagnosis not present

## 2021-10-25 DIAGNOSIS — N8003 Adenomyosis of the uterus: Secondary | ICD-10-CM | POA: Diagnosis not present

## 2021-10-25 DIAGNOSIS — N946 Dysmenorrhea, unspecified: Secondary | ICD-10-CM | POA: Insufficient documentation

## 2021-10-25 DIAGNOSIS — Z9889 Other specified postprocedural states: Secondary | ICD-10-CM

## 2021-10-25 DIAGNOSIS — Z6838 Body mass index (BMI) 38.0-38.9, adult: Secondary | ICD-10-CM | POA: Insufficient documentation

## 2021-10-25 DIAGNOSIS — Z01818 Encounter for other preprocedural examination: Secondary | ICD-10-CM

## 2021-10-25 DIAGNOSIS — E669 Obesity, unspecified: Secondary | ICD-10-CM | POA: Diagnosis not present

## 2021-10-25 HISTORY — DX: Hyperlipidemia, unspecified: E78.5

## 2021-10-25 HISTORY — DX: Other specified postprocedural states: Z98.890

## 2021-10-25 HISTORY — DX: Nausea with vomiting, unspecified: R11.2

## 2021-10-25 HISTORY — DX: Vitamin D deficiency, unspecified: E55.9

## 2021-10-25 HISTORY — DX: Other complications of anesthesia, initial encounter: T88.59XA

## 2021-10-25 HISTORY — PX: CYSTOSCOPY: SHX5120

## 2021-10-25 HISTORY — PX: ROBOTIC ASSISTED TOTAL HYSTERECTOMY WITH BILATERAL SALPINGO OOPHERECTOMY: SHX6086

## 2021-10-25 LAB — POCT PREGNANCY, URINE: Preg Test, Ur: NEGATIVE

## 2021-10-25 LAB — TYPE AND SCREEN
ABO/RH(D): O POS
Antibody Screen: NEGATIVE

## 2021-10-25 LAB — BASIC METABOLIC PANEL
Anion gap: 9 (ref 5–15)
BUN: 12 mg/dL (ref 6–20)
CO2: 29 mmol/L (ref 22–32)
Calcium: 9 mg/dL (ref 8.9–10.3)
Chloride: 100 mmol/L (ref 98–111)
Creatinine, Ser: 0.8 mg/dL (ref 0.44–1.00)
GFR, Estimated: >60 mL/min (ref >60)
Glucose, Bld: 88 mg/dL (ref 70–99)
Potassium: 3 mmol/L — ABNORMAL LOW (ref 3.5–5.1)
Sodium: 138 mmol/L (ref 135–145)

## 2021-10-25 SURGERY — HYSTERECTOMY, TOTAL, ROBOT-ASSISTED, LAPAROSCOPIC, WITH BILATERAL SALPINGO-OOPHORECTOMY
Anesthesia: General

## 2021-10-25 MED ORDER — FENTANYL CITRATE (PF) 250 MCG/5ML IJ SOLN
INTRAMUSCULAR | Status: AC
  Filled 2021-10-25: qty 5

## 2021-10-25 MED ORDER — SODIUM CHLORIDE (PF) 0.9 % IJ SOLN
INTRAMUSCULAR | Status: AC
  Filled 2021-10-25: qty 40

## 2021-10-25 MED ORDER — MIDAZOLAM HCL 2 MG/2ML IJ SOLN
INTRAMUSCULAR | Status: DC | PRN
  Administered 2021-10-25: 2 mg via INTRAVENOUS

## 2021-10-25 MED ORDER — TOPIRAMATE 25 MG PO TABS
50.0000 mg | ORAL_TABLET | Freq: Every day | ORAL | Status: DC
  Administered 2021-10-25: 25 mg via ORAL
  Filled 2021-10-25: qty 2

## 2021-10-25 MED ORDER — DEXMEDETOMIDINE (PRECEDEX) IN NS 20 MCG/5ML (4 MCG/ML) IV SYRINGE
PREFILLED_SYRINGE | INTRAVENOUS | Status: DC | PRN
  Administered 2021-10-25: 12 ug via INTRAVENOUS

## 2021-10-25 MED ORDER — ROCURONIUM BROMIDE 100 MG/10ML IV SOLN
INTRAVENOUS | Status: DC | PRN
  Administered 2021-10-25: 50 mg via INTRAVENOUS
  Administered 2021-10-25: 10 mg via INTRAVENOUS

## 2021-10-25 MED ORDER — PROPOFOL 10 MG/ML IV BOLUS
INTRAVENOUS | Status: AC
Start: 2021-10-25 — End: ?
  Filled 2021-10-25: qty 20

## 2021-10-25 MED ORDER — LIDOCAINE HCL (CARDIAC) PF 100 MG/5ML IV SOSY
PREFILLED_SYRINGE | INTRAVENOUS | Status: DC | PRN
  Administered 2021-10-25: 60 mg via INTRAVENOUS

## 2021-10-25 MED ORDER — SUCCINYLCHOLINE CHLORIDE 200 MG/10ML IV SOSY
PREFILLED_SYRINGE | INTRAVENOUS | Status: DC | PRN
Start: 2021-10-25 — End: 2021-10-25
  Administered 2021-10-25: 160 mg via INTRAVENOUS

## 2021-10-25 MED ORDER — DEXAMETHASONE SODIUM PHOSPHATE 10 MG/ML IJ SOLN
INTRAMUSCULAR | Status: AC
  Filled 2021-10-25: qty 3

## 2021-10-25 MED ORDER — FENTANYL CITRATE (PF) 100 MCG/2ML IJ SOLN
INTRAMUSCULAR | Status: AC
  Filled 2021-10-25: qty 2

## 2021-10-25 MED ORDER — PHENYLEPHRINE 40 MCG/ML (10ML) SYRINGE FOR IV PUSH (FOR BLOOD PRESSURE SUPPORT)
PREFILLED_SYRINGE | INTRAVENOUS | Status: AC
  Filled 2021-10-25: qty 20

## 2021-10-25 MED ORDER — ONDANSETRON HCL 4 MG/2ML IJ SOLN
4.0000 mg | Freq: Four times a day (QID) | INTRAMUSCULAR | Status: DC | PRN
Start: 2021-10-25 — End: 2021-10-26

## 2021-10-25 MED ORDER — DEXMEDETOMIDINE (PRECEDEX) IN NS 20 MCG/5ML (4 MCG/ML) IV SYRINGE
PREFILLED_SYRINGE | INTRAVENOUS | Status: AC
  Filled 2021-10-25: qty 5

## 2021-10-25 MED ORDER — KETAMINE HCL 50 MG/5ML IJ SOSY
PREFILLED_SYRINGE | INTRAMUSCULAR | Status: AC
Start: 2021-10-25 — End: ?
  Filled 2021-10-25: qty 5

## 2021-10-25 MED ORDER — FLUORESCEIN SODIUM 10 % IV SOLN
INTRAVENOUS | Status: DC | PRN
  Administered 2021-10-25: 10 mg via INTRAVENOUS

## 2021-10-25 MED ORDER — TRAMADOL HCL 50 MG PO TABS: 50.0000 mg | ORAL_TABLET | Freq: Four times a day (QID) | ORAL | Status: DC | PRN

## 2021-10-25 MED ORDER — HEMOSTATIC AGENTS (NO CHARGE) OPTIME
TOPICAL | Status: DC | PRN
  Administered 2021-10-25: 1 via TOPICAL

## 2021-10-25 MED ORDER — ACETAMINOPHEN 500 MG PO TABS
ORAL_TABLET | ORAL | Status: AC
  Filled 2021-10-25: qty 2

## 2021-10-25 MED ORDER — TRAMADOL HCL 50 MG PO TABS
ORAL_TABLET | ORAL | Status: AC
  Filled 2021-10-25: qty 1

## 2021-10-25 MED ORDER — TRIAMTERENE-HCTZ 37.5-25 MG PO TABS
1.0000 | ORAL_TABLET | Freq: Every day | ORAL | Status: DC
  Administered 2021-10-25: 1 via ORAL
  Filled 2021-10-25: qty 1

## 2021-10-25 MED ORDER — SODIUM CHLORIDE 0.9 % IR SOLN
Status: DC | PRN
  Administered 2021-10-25: 1000 mL via INTRAVESICAL

## 2021-10-25 MED ORDER — EPHEDRINE 5 MG/ML INJ
INTRAVENOUS | Status: AC
  Filled 2021-10-25: qty 5

## 2021-10-25 MED ORDER — KETOROLAC TROMETHAMINE 30 MG/ML IJ SOLN
INTRAMUSCULAR | Status: DC | PRN
  Administered 2021-10-25: 30 mg via INTRAVENOUS

## 2021-10-25 MED ORDER — ACETAMINOPHEN 500 MG PO TABS
1000.0000 mg | ORAL_TABLET | Freq: Once | ORAL | Status: AC
  Administered 2021-10-25: 1000 mg via ORAL

## 2021-10-25 MED ORDER — TRAMADOL HCL 50 MG PO TABS: 50.0000 mg | ORAL_TABLET | Freq: Four times a day (QID) | ORAL | 2 refills | Status: AC | PRN

## 2021-10-25 MED ORDER — MENTHOL 3 MG MT LOZG: 1.0000 | LOZENGE | OROMUCOSAL | Status: DC | PRN

## 2021-10-25 MED ORDER — CEFAZOLIN SODIUM-DEXTROSE 2-4 GM/100ML-% IV SOLN
INTRAVENOUS | Status: AC
  Filled 2021-10-25: qty 100

## 2021-10-25 MED ORDER — KETOROLAC TROMETHAMINE 30 MG/ML IJ SOLN
30.0000 mg | Freq: Once | INTRAMUSCULAR | Status: AC
  Administered 2021-10-25: 30 mg via INTRAVENOUS

## 2021-10-25 MED ORDER — AMISULPRIDE (ANTIEMETIC) 5 MG/2ML IV SOLN: 10.0000 mg | Freq: Once | INTRAVENOUS | Status: DC | PRN

## 2021-10-25 MED ORDER — ROPIVACAINE HCL 5 MG/ML IJ SOLN
INTRAMUSCULAR | Status: DC | PRN
  Administered 2021-10-25: 60 mL

## 2021-10-25 MED ORDER — FENTANYL CITRATE (PF) 100 MCG/2ML IJ SOLN
INTRAMUSCULAR | Status: DC | PRN
  Administered 2021-10-25 (×3): 50 ug via INTRAVENOUS
  Administered 2021-10-25: 100 ug via INTRAVENOUS

## 2021-10-25 MED ORDER — KETAMINE HCL 10 MG/ML IJ SOLN
INTRAMUSCULAR | Status: DC | PRN
  Administered 2021-10-25: 25 mg via INTRAVENOUS

## 2021-10-25 MED ORDER — KETOROLAC TROMETHAMINE 30 MG/ML IJ SOLN
INTRAMUSCULAR | Status: AC
  Filled 2021-10-25: qty 2

## 2021-10-25 MED ORDER — ONDANSETRON HCL 4 MG/2ML IJ SOLN
INTRAMUSCULAR | Status: DC | PRN
  Administered 2021-10-25: 4 mg via INTRAVENOUS

## 2021-10-25 MED ORDER — PROPOFOL 10 MG/ML IV BOLUS
INTRAVENOUS | Status: DC | PRN
  Administered 2021-10-25: 50 mg via INTRAVENOUS
  Administered 2021-10-25: 150 mg via INTRAVENOUS

## 2021-10-25 MED ORDER — CEFAZOLIN SODIUM-DEXTROSE 2-4 GM/100ML-% IV SOLN
2.0000 g | Freq: Once | INTRAVENOUS | Status: AC
Start: 2021-10-25 — End: 2021-10-25
  Administered 2021-10-25: 2 g via INTRAVENOUS

## 2021-10-25 MED ORDER — LACTATED RINGERS IV SOLN: INTRAVENOUS | Status: DC

## 2021-10-25 MED ORDER — IBUPROFEN 800 MG PO TABS
ORAL_TABLET | ORAL | Status: AC
  Filled 2021-10-25: qty 1

## 2021-10-25 MED ORDER — SENNA 8.6 MG PO TABS
1.0000 | ORAL_TABLET | Freq: Two times a day (BID) | ORAL | Status: DC
  Administered 2021-10-25: 8.6 mg via ORAL

## 2021-10-25 MED ORDER — SCOPOLAMINE 1 MG/3DAYS TD PT72
1.0000 | MEDICATED_PATCH | TRANSDERMAL | Status: DC
  Administered 2021-10-25: 1.5 mg via TRANSDERMAL

## 2021-10-25 MED ORDER — KETOROLAC TROMETHAMINE 30 MG/ML IJ SOLN: 30.0000 mg | Freq: Once | INTRAMUSCULAR | Status: DC

## 2021-10-25 MED ORDER — LACTATED RINGERS IV SOLN
INTRAVENOUS | Status: DC
Start: 2021-10-25 — End: 2021-10-26

## 2021-10-25 MED ORDER — SENNA 8.6 MG PO TABS
ORAL_TABLET | ORAL | Status: AC
  Filled 2021-10-25: qty 1

## 2021-10-25 MED ORDER — ROCURONIUM BROMIDE 10 MG/ML (PF) SYRINGE
PREFILLED_SYRINGE | INTRAVENOUS | Status: AC
Start: 2021-10-25 — End: ?
  Filled 2021-10-25: qty 20

## 2021-10-25 MED ORDER — ONDANSETRON HCL 4 MG/2ML IJ SOLN
INTRAMUSCULAR | Status: AC
  Filled 2021-10-25: qty 6

## 2021-10-25 MED ORDER — IBUPROFEN 800 MG PO TABS
800.0000 mg | ORAL_TABLET | Freq: Three times a day (TID) | ORAL | Status: DC
  Administered 2021-10-25: 800 mg via ORAL

## 2021-10-25 MED ORDER — DEXAMETHASONE SODIUM PHOSPHATE 4 MG/ML IJ SOLN
INTRAMUSCULAR | Status: DC | PRN
  Administered 2021-10-25: 10 mg via INTRAVENOUS

## 2021-10-25 MED ORDER — ARTIFICIAL TEARS OPHTHALMIC OINT
TOPICAL_OINTMENT | OPHTHALMIC | Status: AC
  Filled 2021-10-25: qty 14

## 2021-10-25 MED ORDER — PROPOFOL 500 MG/50ML IV EMUL
INTRAVENOUS | Status: DC | PRN
  Administered 2021-10-25: 50 ug/kg/min via INTRAVENOUS

## 2021-10-25 MED ORDER — ONDANSETRON HCL 4 MG PO TABS: 4.0000 mg | ORAL_TABLET | Freq: Four times a day (QID) | ORAL | Status: DC | PRN

## 2021-10-25 MED ORDER — ONDANSETRON HCL 4 MG/2ML IJ SOLN: 4.0000 mg | Freq: Once | INTRAMUSCULAR | Status: DC | PRN

## 2021-10-25 MED ORDER — MIDAZOLAM HCL 2 MG/2ML IJ SOLN
INTRAMUSCULAR | Status: AC
  Filled 2021-10-25: qty 2

## 2021-10-25 MED ORDER — TRAMADOL HCL 50 MG PO TABS: 50.0000 mg | ORAL_TABLET | Freq: Once | ORAL | Status: DC | PRN

## 2021-10-25 MED ORDER — SUGAMMADEX SODIUM 200 MG/2ML IV SOLN
INTRAVENOUS | Status: DC | PRN
  Administered 2021-10-25: 200 mg via INTRAVENOUS

## 2021-10-25 MED ORDER — LIDOCAINE HCL (PF) 2 % IJ SOLN
INTRAMUSCULAR | Status: AC
  Filled 2021-10-25: qty 15

## 2021-10-25 MED ORDER — PROPOFOL 1000 MG/100ML IV EMUL
INTRAVENOUS | Status: AC
  Filled 2021-10-25: qty 100

## 2021-10-25 MED ORDER — TRAMADOL HCL 50 MG PO TABS
50.0000 mg | ORAL_TABLET | Freq: Four times a day (QID) | ORAL | Status: DC | PRN
  Administered 2021-10-25: 50 mg via ORAL

## 2021-10-25 MED ORDER — EPINEPHRINE 0.3 MG/0.3ML IJ SOAJ: 0.3000 mg | Freq: Once | INTRAMUSCULAR | Status: DC

## 2021-10-25 MED ORDER — TOPIRAMATE 25 MG PO TABS: 50.0000 mg | ORAL_TABLET | Freq: Every day | ORAL | Status: DC

## 2021-10-25 MED ORDER — FENTANYL CITRATE (PF) 100 MCG/2ML IJ SOLN
25.0000 ug | INTRAMUSCULAR | Status: DC | PRN
  Administered 2021-10-25 (×2): 50 ug via INTRAVENOUS

## 2021-10-25 MED ORDER — SCOPOLAMINE 1 MG/3DAYS TD PT72
MEDICATED_PATCH | TRANSDERMAL | Status: AC
  Filled 2021-10-25: qty 1

## 2021-10-25 SURGICAL SUPPLY — 63 items
ADH SKN CLS APL DERMABOND .7 (GAUZE/BANDAGES/DRESSINGS) ×2
APL SRG 38 LTWT LNG FL B (MISCELLANEOUS) ×2
APPLICATOR ARISTA FLEXITIP XL (MISCELLANEOUS) ×1 IMPLANT
BARRIER ADHS 3X4 INTERCEED (GAUZE/BANDAGES/DRESSINGS) IMPLANT
BRR ADH 4X3 ABS CNTRL BYND (GAUZE/BANDAGES/DRESSINGS)
COVER BACK TABLE 60X90IN (DRAPES) ×3 IMPLANT
COVER TIP SHEARS 8 DVNC (MISCELLANEOUS) ×2 IMPLANT
COVER TIP SHEARS 8MM DA VINCI (MISCELLANEOUS) ×3
DECANTER SPIKE VIAL GLASS SM (MISCELLANEOUS) ×3 IMPLANT
DEFOGGER SCOPE WARMER CLEARIFY (MISCELLANEOUS) ×3 IMPLANT
DERMABOND ADVANCED (GAUZE/BANDAGES/DRESSINGS) ×1
DERMABOND ADVANCED .7 DNX12 (GAUZE/BANDAGES/DRESSINGS) ×2 IMPLANT
DRAPE ARM DVNC X/XI (DISPOSABLE) ×8 IMPLANT
DRAPE COLUMN DVNC XI (DISPOSABLE) ×2 IMPLANT
DRAPE DA VINCI XI ARM (DISPOSABLE) ×12
DRAPE DA VINCI XI COLUMN (DISPOSABLE) ×3
DRAPE ROBOTICS STRL (DRAPES) ×3 IMPLANT
DRAPE UTILITY XL STRL (DRAPES) ×3 IMPLANT
DURAPREP 26ML APPLICATOR (WOUND CARE) ×3 IMPLANT
ELECT REM PT RETURN 9FT ADLT (ELECTROSURGICAL) ×3
ELECTRODE REM PT RTRN 9FT ADLT (ELECTROSURGICAL) ×2 IMPLANT
GAUZE 4X4 16PLY ~~LOC~~+RFID DBL (SPONGE) ×6 IMPLANT
GLOVE SRG 8 PF TXTR STRL LF DI (GLOVE) ×2 IMPLANT
GLOVE SURG ENC MOIS LTX SZ6 (GLOVE) IMPLANT
GLOVE SURG ENC MOIS LTX SZ7 (GLOVE) ×12 IMPLANT
GLOVE SURG UNDER POLY LF SZ6 (GLOVE) IMPLANT
GLOVE SURG UNDER POLY LF SZ8 (GLOVE) ×3
HEMOSTAT ARISTA ABSORB 3G PWDR (HEMOSTASIS) ×1 IMPLANT
HOLDER FOLEY CATH W/STRAP (MISCELLANEOUS) IMPLANT
IRRIG SUCT STRYKERFLOW 2 WTIP (MISCELLANEOUS) ×3
IRRIGATION SUCT STRKRFLW 2 WTP (MISCELLANEOUS) ×2 IMPLANT
KIT TURNOVER CYSTO (KITS) ×3 IMPLANT
LEGGING LITHOTOMY PAIR STRL (DRAPES) ×3 IMPLANT
MANIPULATOR ADVINCU DEL 2.5 PL (MISCELLANEOUS) IMPLANT
MANIPULATOR ADVINCU DEL 3.0 PL (MISCELLANEOUS) IMPLANT
MANIPULATOR ADVINCU DEL 3.5 PL (MISCELLANEOUS) ×1 IMPLANT
MANIPULATOR ADVINCU DEL 4.0 PL (MISCELLANEOUS) IMPLANT
NEEDLE INSUFFLATION 120MM (ENDOMECHANICALS) ×3 IMPLANT
OBTURATOR OPTICAL STANDARD 8MM (TROCAR) ×3
OBTURATOR OPTICAL STND 8 DVNC (TROCAR) ×2
OBTURATOR OPTICALSTD 8 DVNC (TROCAR) ×2 IMPLANT
PACK ROBOT WH (CUSTOM PROCEDURE TRAY) ×3 IMPLANT
PACK ROBOTIC GOWN (GOWN DISPOSABLE) ×3 IMPLANT
PACK TRENDGUARD 450 HYBRID PRO (MISCELLANEOUS) IMPLANT
PAD OB MATERNITY 4.3X12.25 (PERSONAL CARE ITEMS) ×3 IMPLANT
PAD PREP 24X48 CUFFED NSTRL (MISCELLANEOUS) ×3 IMPLANT
POUCH LAPAROSCOPIC INSTRUMENT (MISCELLANEOUS) IMPLANT
PROTECTOR NERVE ULNAR (MISCELLANEOUS) ×6 IMPLANT
SEAL CANN UNIV 5-8 DVNC XI (MISCELLANEOUS) ×6 IMPLANT
SEAL XI 5MM-8MM UNIVERSAL (MISCELLANEOUS) ×9
SET IRRIG Y TYPE TUR BLADDER L (SET/KITS/TRAYS/PACK) ×3 IMPLANT
SET TRI-LUMEN FLTR TB AIRSEAL (TUBING) ×3 IMPLANT
SPONGE T-LAP 4X18 ~~LOC~~+RFID (SPONGE) ×3 IMPLANT
SUT VIC AB 0 CT1 36 (SUTURE) ×3 IMPLANT
SUT VICRYL RAPIDE 3 0 (SUTURE) ×6 IMPLANT
SUT VLOC 180 0 9IN  GS21 (SUTURE) ×3
SUT VLOC 180 0 9IN GS21 (SUTURE) ×2 IMPLANT
TOWEL OR 17X26 10 PK STRL BLUE (TOWEL DISPOSABLE) ×3 IMPLANT
TRAY FOLEY W/BAG SLVR 14FR LF (SET/KITS/TRAYS/PACK) ×3 IMPLANT
TRENDGUARD 450 HYBRID PRO PACK (MISCELLANEOUS)
TROCAR BLADELESS OPT 5 100 (ENDOMECHANICALS) IMPLANT
TROCAR PORT AIRSEAL 5X120 (TROCAR) ×3 IMPLANT
WATER STERILE IRR 1000ML POUR (IV SOLUTION) ×3 IMPLANT

## 2021-10-25 NOTE — Anesthesia Procedure Notes (Signed)
Procedure Name: Intubation ?Date/Time: 10/25/2021 2:01 PM ?Performed by: Georgeanne Nim, CRNA ?Pre-anesthesia Checklist: Patient identified, Emergency Drugs available, Suction available, Patient being monitored and Timeout performed ?Patient Re-evaluated:Patient Re-evaluated prior to induction ?Oxygen Delivery Method: Circle system utilized ?Preoxygenation: Pre-oxygenation with 100% oxygen ?Induction Type: IV induction ?Ventilation: Mask ventilation without difficulty ?Laryngoscope Size: Mac and 4 ?Grade View: Grade I ?Tube type: Oral ?Tube size: 7.0 mm ?Number of attempts: 1 ?Airway Equipment and Method: Stylet ?Placement Confirmation: ETT inserted through vocal cords under direct vision, positive ETCO2, CO2 detector and breath sounds checked- equal and bilateral ?Secured at: 22 cm ?Tube secured with: Tape ?Dental Injury: Teeth and Oropharynx as per pre-operative assessment  ? ? ? ? ?

## 2021-10-25 NOTE — Anesthesia Postprocedure Evaluation (Signed)
Anesthesia Post Note ? ?Patient: [name] ? ?Procedure(s) Performed: XI ROBOTIC ASSISTED TOTAL HYSTERECTOMY WITH BILATERAL SALPINGECTOMY (Bilateral) ?CYSTOSCOPY ? ?  ? ?Patient location during evaluation: PACU ?Anesthesia Type: General ?Level of consciousness: awake ?Pain management: pain level controlled ?Vital Signs Assessment: post-procedure vital signs reviewed and stable ?Respiratory status: spontaneous breathing, nonlabored ventilation, respiratory function stable and patient connected to nasal cannula oxygen ?Cardiovascular status: blood pressure returned to baseline and stable ?Postop Assessment: no apparent nausea or vomiting ?Anesthetic complications: no ? ? ?No notable events documented. ? ?Last Vitals:  ?Vitals:  ? 10/25/21 1757 10/25/21 1900  ?BP: (!) 137/99 (!) 154/93  ?Pulse: 86 (!) 101  ?Resp: 16 15  ?Temp: 36.9 ?C 37 ?C  ?SpO2: 96% 98%  ?  ?Last Pain:  ?Vitals:  ? 10/25/21 1900  ?TempSrc:   ?PainSc: 7   ? ? ?  ?  ?  ?  ?  ?  ? ?Corneilus Heggie P Sita Mangen ? ? ? ? ?

## 2021-10-25 NOTE — Anesthesia Preprocedure Evaluation (Addendum)
Anesthesia Evaluation  ?Patient identified by MRN, date of birth, ID band ?Patient awake ? ? ? ?Reviewed: ?Allergy & Precautions, NPO status , Patient's Chart, lab work & pertinent test results ? ?History of Anesthesia Complications ?(+) PONV and history of anesthetic complications ? ?Airway ?Mallampati: II ? ?TM Distance: >3 FB ?Neck ROM: Full ? ? ? Dental ?no notable dental hx. ? ?  ?Pulmonary ?neg pulmonary ROS,  ?  ?Pulmonary exam normal ?breath sounds clear to auscultation ? ? ? ? ? ? Cardiovascular ?hypertension, Pt. on medications ?negative cardio ROS ?Normal cardiovascular exam ?Rhythm:Regular Rate:Normal ? ? ?  ?Neuro/Psych ? Headaches, negative psych ROS  ? GI/Hepatic ?negative GI ROS, Neg liver ROS,   ?Endo/Other  ?obesity ? Renal/GU ?negative Renal ROS  ?negative genitourinary ?  ?Musculoskeletal ?negative musculoskeletal ROS ?(+)  ? Abdominal ?  ?Peds ?negative pediatric ROS ?(+)  Hematology ?negative hematology ROS ?(+)   ?Anesthesia Other Findings ?Hypokalemia - started on K+ supplement 2 days ago ? Reproductive/Obstetrics ?negative OB ROS ? ?  ? ? ? ? ? ? ? ? ? ? ? ? ? ?  ?  ? ? ? ? ? ? ? ?Anesthesia Physical ?Anesthesia Plan ? ?ASA: 2 ? ?Anesthesia Plan: General  ? ?Post-op Pain Management: Minimal or no pain anticipated  ? ?Induction: Intravenous ? ?PONV Risk Score and Plan: 4 or greater and Treatment may vary due to age or medical condition, Midazolam, Scopolamine patch - Pre-op, Dexamethasone and Ondansetron ? ?Airway Management Planned: Oral ETT ? ?Additional Equipment: None ? ?Intra-op Plan:  ? ?Post-operative Plan: Extubation in OR ? ?Informed Consent: I have reviewed the patients History and Physical, chart, labs and discussed the procedure including the risks, benefits and alternatives for the proposed anesthesia with the patient or authorized representative who has indicated his/her understanding and acceptance.  ? ? ? ?Dental advisory given ? ?Plan  Discussed with: CRNA ? ?Anesthesia Plan Comments: (Dr. Henderson Cloud started patient on potassium 2 days ago. Will re-check today. Likely etiology is her diuretic and patient also had recent diarrhea illness. GETA. Will use succinycholine for intubation. Tanna Furry, MD  ?)  ? ? ? ? ? ?Anesthesia Quick Evaluation ? ?

## 2021-10-25 NOTE — Brief Op Note (Signed)
10/25/2021 ? ?3:58 PM ? ?PATIENT:  [name]  41 y.o. female ? ?PRE-OPERATIVE DIAGNOSIS:  dysmenorrhea ? ?POST-OPERATIVE DIAGNOSIS:  dysmenorrhea ? ?PROCEDURE:  Procedure(s): ?XI ROBOTIC ASSISTED TOTAL HYSTERECTOMY WITH BILATERAL SALPINGECTOMY (Bilateral) ?CYSTOSCOPY (N/A) ? ?SURGEON:  Surgeon(s) and Role: ?   Carrington Clamp, MD - Primary ?   Charlett Nose, MD - Assisting ? ?ANESTHESIA:   general ? ?EBL:  200 mL  ? ?LOCAL MEDICATIONS USED:  OTHER Ropivicaine, arista and fluorocein ? ?SPECIMEN:  Source of Specimen:  Uterus, tubes, cervix ? ?DISPOSITION OF SPECIMEN:  PATHOLOGY ? ?COUNTS:  YES ? ?TOURNIQUET:  * No tourniquets in log * ? ?DICTATION: .Note written in EPIC ? ?PLAN OF CARE: Admit for overnight observation ? ?PATIENT DISPOSITION:  PACU - hemodynamically stable. ?  ?Delay start of Pharmacological VTE agent (>24hrs) due to surgical blood loss or risk of bleeding: not applicable ? ?

## 2021-10-25 NOTE — Discharge Summary (Signed)
Physician Discharge Summary  ?Patient ID: ?[name] ?MRN: [id number] ?DOB/AGE: [birth year] 42 y.o. ? ?Admit date: 10/25/2021 ?Discharge date: 10/25/2021 ? ?Admission Diagnoses: ? ?Discharge Diagnoses:  ?Active Problems: ?  * No active hospital problems. * ? ? ?Discharged Condition: good ? ?Hospital Course: robotic assisted total laparoscopic hysterectomy with bilateral salpingectomies ? ?Consults: None ? ?Significant Diagnostic Studies: none ? ? ?Treatments: surgery: robotic assisted total laparoscopic hysterectomy with bilateral salpingectomies ? ?Discharge Exam: ?Blood pressure (!) 139/91, pulse 94, temperature 98.5 ?F (36.9 ?C), temperature source Oral, resp. rate 20, height 5\' 5"  (1.651 m), weight 105.4 kg, last menstrual period 10/21/2021, SpO2 100 %, not currently breastfeeding. ? ? ?Disposition: Discharge disposition: 01-Home or Self Care ? ? ? ? ? ? ?Discharge Instructions   ? ? Call MD for:  temperature >100.4   Complete by: As directed ?  ? Diet - low sodium heart healthy   Complete by: As directed ?  ? Discharge instructions   Complete by: As directed ?  ? No driving on narcotics, no sexual activity for 2 weeks.  ? Increase activity slowly   Complete by: As directed ?  ? May shower / Bathe   Complete by: As directed ?  ? Shower, no bath for 2 weeks.  ? Remove dressing in 24 hours   Complete by: As directed ?  ? Sexual Activity Restrictions   Complete by: As directed ?  ? No sexual activity for 2 weeks.  ? ?  ? ?Allergies as of 10/25/2021   ? ?   Reactions  ? Hydrocodone Nausea And Vomiting  ? Oxycodone Nausea And Vomiting  ? Percocet [oxycodone-acetaminophen] Nausea And Vomiting  ? Garlic Rash  ? Shellfish Allergy Swelling  ? ?  ? ?  ?Medication List  ?  ? ?TAKE these medications   ? ?EPINEPHrine 0.3 mg/0.3 mL Soaj injection ?Commonly known as: EPI-PEN ?Inject 0.3 mg into the muscle once. ?  ?OZEMPIC (1 MG/DOSE) [place] ?Inject into the skin once a week. For weight loss. ?  ?topiramate 50 MG tablet ?Commonly  known as: TOPAMAX ?Take 50 mg by mouth daily. For weight loss. ?  ?traMADol 50 MG tablet ?Commonly known as: ULTRAM ?Take 1 tablet (50 mg total) by mouth every 6 (six) hours as needed. ?What changed: how much to take ?  ?tretinoin 0.05 % cream ?Commonly known as: RETIN-A ?APPLY TO ACNE AT BEDTIME ?What changed: See the new instructions. ?  ?triamterene-hydrochlorothiazide 37.5-25 MG tablet ?Commonly known as: MAXZIDE-25 ?Take 1/2 tablets daily in am ?What changed: additional instructions ?  ? ?  ? ? Follow-up Information   ? ? 05-[birth year]1992, MD Follow up in 2 week(s).   ?Specialty: Obstetrics and Gynecology ?Contact information: ?[address] ?[address] ?[place] ?[phone] ? ? ?  ?  ? ?  ?  ? ?  ? ? ?Signed: ?[phone] ?10/25/2021, 4:16 PM ? ? ?

## 2021-10-25 NOTE — Transfer of Care (Signed)
Immediate Anesthesia Transfer of Care Note ? ?Patient: [name] ? ?Procedure(s) Performed: XI ROBOTIC ASSISTED TOTAL HYSTERECTOMY WITH BILATERAL SALPINGECTOMY (Bilateral) ?CYSTOSCOPY ? ?Patient Location: PACU ? ?Anesthesia Type:General ? ?Level of Consciousness: drowsy ? ?Airway & Oxygen Therapy: Patient Spontanous Breathing ? ?Post-op Assessment: Report given to RN and Post -op Vital signs reviewed and stable ? ?Post vital signs: Reviewed and stable ? ?Last Vitals:  ?Vitals Value Taken Time  ?BP 146/95 10/25/21 1618  ?Temp 36.4 ?C 10/25/21 1618  ?Pulse 88 10/25/21 1625  ?Resp 12 10/25/21 1625  ?SpO2 99 % 10/25/21 1625  ?Vitals shown include unvalidated device data. ? ?Last Pain:  ?Vitals:  ? 10/25/21 1618  ?TempSrc:   ?PainSc: 0-No pain  ?   ? ?Patients Stated Pain Goal: 5 (10/25/21 1050) ? ?Complications: No notable events documented. ?

## 2021-10-25 NOTE — Op Note (Signed)
10/25/2021 ? ?3:58 PM ? ?PATIENT:  [name]  41 y.o. female ? ?PRE-OPERATIVE DIAGNOSIS:  dysmenorrhea ? ?POST-OPERATIVE DIAGNOSIS:  dysmenorrhea ? ?PROCEDURE:  Procedure(s): ?XI ROBOTIC ASSISTED TOTAL HYSTERECTOMY WITH BILATERAL SALPINGECTOMY (Bilateral) ?CYSTOSCOPY (N/A) ? ?SURGEON:  Surgeon(s) and Role: ?   Carrington Clamp, MD - Primary ?   Charlett Nose, MD - Assisting ? ?ANESTHESIA:   general ? ?EBL:  200 mL  ? ?LOCAL MEDICATIONS USED:  OTHER Ropivicaine, arista and fluorocein ? ?SPECIMEN:  Source of Specimen:  Uterus, tubes, cervix ? ?DISPOSITION OF SPECIMEN:  PATHOLOGY ? ?COUNTS:  YES ? ?TOURNIQUET:  * No tourniquets in log * ? ?DICTATION: .Note written in EPIC ? ?PLAN OF CARE: Admit for overnight observation ? ?PATIENT DISPOSITION:  PACU - hemodynamically stable. ? ?Complications:  None. ? ?Findings:  8 weeks size bulky and boggy uterus.  Ovaries were normal.  There were filmy adhesions of the ovaries to the uterus, the bowl to the uterus and the uterus and tube on the L to the anterior abdominal wall.  There were several serous cysts coming from the tubes and the uterus anteriorly.  The L tube was very dilated and friable. The R ureter was identified during multiple points of the case and were always out of the field of dissection; the left ureter was obscured by bowel on the L but was not in the field of dissection.  On cystoscopy, the bladder was intact and bilateral spill was seen from each ureteral orriface.   ? ?  ?  ?Technique: ?  ?After adequate anesthesia was achieved the patient was positioned, prepped and draped in usual sterile fashion.  A speculum was placed in the vagina and the cervix dilated with pratt dilators.  The 3.5 cm Koh ring Advincula was assembled and placed in proper fashion.  The  Speculum was removed and the bladder catheterized with a foley.   ?  ?Attention was turned to the abdomen where a 1 cm incision was made 1 cm above the umbilicus.  The veress needle was  introduced without aspiration of bowel contents or blood and the abdomen was unable to be insufflated.with correct pressure several times.  The 5 mm scope was used in the robotic trocar as an optiview and direct entry was made carefully and without complication. The camera was introduced and the other three trocar sites were marked out, all approximately 10 cm from each other and the camera.  Two 8.5 mm trocars were placed on either side of the camera port and a 5 mm assistant port was placed 3 cm above the line of the other trocars.  All trocars were inserted under direct visualization of the camera.  The patient was placed in trendelenburg and then the Robot docked.  The fenestrated bipolar were placed on arm 1 and the Hot shears on arm 3 and introduced under direct visualization of the camera. ?  ?I then broke scrub and sat down at the console.  The above findings were noted and the right ureter identified well out of the field of dissection.  The adhesions were taken down with sharp and unipolar cautery.  The left tube was peeled off the anterior abdominal wall.  The bowel was well out of the field of dissection taking down the filmy adhesions.  The right fallopian tube was isolated and cauterized with the bipolar.  The Utero-ovarian ligament was then divided with the bipolar cautery and shears.  The posterior broad ligament was then  divided with the hot shears until the uterosacral ligament.  The Broad and cardinal ligaments were then cauterized against the cervix to the level of the Koh ring, securing the uterine artery.  Each pedicle was then incised with the shears.  The anterior leaf was then incised at the reflection of the vessico-uterine junction and the lateral bladder retracted inferiorly after the round ligament had been divided with the bipolar forceps.  The left tube was cauterized with the bipolar and divided with the shears;  then the left utero-ovarian ligament divided with the bipolar forceps  and the scissors.  The round ligament was divided as well and the posterior leaf of the broad ligament then divided with the hot shears. The broad and cardinal ligaments were then cauterized on the left in the same way.   At the level of the internal os, the uterine arteries were bilaterally cauterized with the bipolar.  The ureters were identified well out of the field of dissection.   ?  ?The bladder was then able to be retracted inferiorly and the vesico-uterine fascia was incised in the midline until the bladder was removed one cm below the Koh ring.  The hot shears then circumferentially incised the vagina at the level of the reflection on the [facility] ring.  Once the uterus and cervix were amputated, cautery was used to insure hemostasis of the cuff and the uterus and tubes were removed through the vagina.  Once hemostasis was achieved, the scissors were changed to the mega suture cut needle driver and the cuff was closed with two running stitches of 0-vicryl V loc.  Cautery was used to ensure hemostasis of the left pedicles very superficially. The right ureter were peristalsing bilaterally well and very lateral to the areas of operation; the left was not seen above the level of dissection ?  ?The Robot was then undocked and I scrubbed back in.  The needle was removed and Arista and Ropivicaine were introduced into the pelvis. The skin incisions were closed with subcuticular stitches of 3-0 vicryl Rapide and Dermabond.  All instruments were removed from the vagina and cystoscopy performed, revealing an intact bladder and vigourous spill of urine from each ureteral orifice.  The cystoscope was removed and the patient taken to the recovery room in stable condition. ?  ?Jahkeem Kurka A ? ? ?  ?

## 2021-10-26 ENCOUNTER — Encounter (HOSPITAL_BASED_OUTPATIENT_CLINIC_OR_DEPARTMENT_OTHER): Payer: Self-pay | Admitting: Obstetrics and Gynecology

## 2021-10-27 LAB — SURGICAL PATHOLOGY

## 2022-05-23 ENCOUNTER — Other Ambulatory Visit (HOSPITAL_BASED_OUTPATIENT_CLINIC_OR_DEPARTMENT_OTHER): Payer: Self-pay

## 2022-05-23 ENCOUNTER — Other Ambulatory Visit: Payer: Self-pay

## 2022-05-23 ENCOUNTER — Encounter (HOSPITAL_BASED_OUTPATIENT_CLINIC_OR_DEPARTMENT_OTHER): Payer: Self-pay

## 2022-05-23 ENCOUNTER — Emergency Department (HOSPITAL_BASED_OUTPATIENT_CLINIC_OR_DEPARTMENT_OTHER)
Admission: EM | Admit: 2022-05-23 | Discharge: 2022-05-23 | Disposition: A | Discharge status: Home / Self Care | Payer: BC Managed Care – PPO | Attending: Emergency Medicine | Admitting: Emergency Medicine

## 2022-05-23 ENCOUNTER — Emergency Department (HOSPITAL_BASED_OUTPATIENT_CLINIC_OR_DEPARTMENT_OTHER): Payer: BC Managed Care – PPO

## 2022-05-23 DIAGNOSIS — E876 Hypokalemia: Secondary | ICD-10-CM | POA: Insufficient documentation

## 2022-05-23 DIAGNOSIS — Z79899 Other long term (current) drug therapy: Secondary | ICD-10-CM | POA: Insufficient documentation

## 2022-05-23 DIAGNOSIS — R519 Headache, unspecified: Secondary | ICD-10-CM | POA: Diagnosis present

## 2022-05-23 DIAGNOSIS — I10 Essential (primary) hypertension: Secondary | ICD-10-CM | POA: Insufficient documentation

## 2022-05-23 LAB — CBC
HCT: 45.6 % (ref 36.0–46.0)
Hemoglobin: 15 g/dL (ref 12.0–15.0)
MCH: 26.2 pg (ref 26.0–34.0)
MCHC: 32.9 g/dL (ref 30.0–36.0)
MCV: 79.6 fL — ABNORMAL LOW (ref 80.0–100.0)
Platelets: 382 10*3/uL (ref 150–400)
RBC: 5.73 MIL/uL — ABNORMAL HIGH (ref 3.87–5.11)
RDW: 13.7 % (ref 11.5–15.5)
WBC: 8.8 10*3/uL (ref 4.0–10.5)
nRBC: 0 % (ref 0.0–0.2)

## 2022-05-23 LAB — BASIC METABOLIC PANEL
Anion gap: 9 (ref 5–15)
BUN: 8 mg/dL (ref 6–20)
CO2: 31 mmol/L (ref 22–32)
Calcium: 10 mg/dL (ref 8.9–10.3)
Chloride: 99 mmol/L (ref 98–111)
Creatinine, Ser: 0.73 mg/dL (ref 0.44–1.00)
GFR, Estimated: >60 mL/min (ref >60)
Glucose, Bld: 80 mg/dL (ref 70–99)
Potassium: 3 mmol/L — ABNORMAL LOW (ref 3.5–5.1)
Sodium: 139 mmol/L (ref 135–145)

## 2022-05-23 MED ORDER — PROCHLORPERAZINE EDISYLATE 10 MG/2ML IJ SOLN
10.0000 mg | Freq: Once | INTRAMUSCULAR | Status: AC
  Administered 2022-05-23: 10 mg via INTRAVENOUS
  Filled 2022-05-23: qty 2

## 2022-05-23 MED ORDER — KETOROLAC TROMETHAMINE 15 MG/ML IJ SOLN
15.0000 mg | Freq: Once | INTRAMUSCULAR | Status: AC
  Administered 2022-05-23: 15 mg via INTRAVENOUS
  Filled 2022-05-23: qty 1

## 2022-05-23 MED ORDER — POTASSIUM CHLORIDE CRYS ER 20 MEQ PO TBCR
40.0000 meq | EXTENDED_RELEASE_TABLET | Freq: Once | ORAL | Status: AC
  Administered 2022-05-23: 40 meq via ORAL
  Filled 2022-05-23: qty 2

## 2022-05-23 MED ORDER — POTASSIUM CHLORIDE CRYS ER 20 MEQ PO TBCR
20.0000 meq | EXTENDED_RELEASE_TABLET | Freq: Two times a day (BID) | ORAL | 0 refills | Status: AC
  Filled 2022-05-23: qty 14, 7d supply, fill #0

## 2022-05-23 NOTE — ED Notes (Signed)
Pt d/c home per MD order. Discharge summary reviewed with pt, pt verbalizes understanding. Ambulatory off unit. No s/s of acute distress noted at discharge.  °

## 2022-05-23 NOTE — ED Triage Notes (Signed)
Pt c/o headache and elevated blood pressure for the past couple of days. States she was started on a new bp medication a couple days ago. Pt denies any other associated symptoms.

## 2022-05-23 NOTE — ED Provider Notes (Signed)
[facility] EMERGENCY DEPT Provider Note   CSN: [id number] Arrival date & time: 05/23/22  0946     History  Chief Complaint  Patient presents with   Hypertension    [name] is a 41 y.o. female.   Hypertension     Patient presents to the ED for evaluation of headache, an episode of tingling on the left side yesterday associated with her lips turning blue.  Patient states she has a history of hypertension.  Her doctor had to change her medications recently because she was having trouble with low potassium.  Patient feels like the symptoms got worse after starting the new medication.  Last night she noticed her lips looking blue associated with tingling on the left side.  Ended up resolving.  She spoke to her doctor who suggested she come to the emergency room for evaluation.  She is not having any trouble with her speech and balance or coordination.  She has had a headache and the light bothers her eyes.  Patient states she used to have headaches previously but has not recently been having regular headaches.  She denies any fevers or chills.  No vomiting or diarrhea.  She did have an episode of discomfort in her chest but she states she felt a bulge in the area after lifting weights.  Home Medications Prior to Admission medications   Medication Sig Start Date End Date Taking? Authorizing Provider  EPINEPHrine 0.3 mg/0.3 mL IJ SOAJ injection Inject 0.3 mg into the muscle once.    [provider]  famotidine (PEPCID) 20 MG tablet Take 20 mg by mouth 2 (two) times daily. 05/21/22   [provider]  hydrochlorothiazide (HYDRODIURIL) 25 MG tablet Take 25 mg by mouth daily. 05/22/22   [provider]  losartan (COZAAR) 100 MG tablet Take 100 mg by mouth daily. 04/18/22   [provider]  meloxicam (MOBIC) 15 MG tablet Take 15 mg by mouth daily. 05/21/22   [provider]  rosuvastatin (CRESTOR) 5 MG tablet Take 5 mg by mouth  daily. 04/24/22   [provider]  Semaglutide (OZEMPIC, 1 MG/DOSE, [place]) Inject into the skin once a week. For weight loss.    [provider]  topiramate (TOPAMAX) 50 MG tablet Take 50 mg by mouth daily. For weight loss.    [provider]  traMADol (ULTRAM) 50 MG tablet Take 1 tablet (50 mg total) by mouth every 6 (six) hours as needed. 10/25/21   Carrington Clamp, MD  tretinoin (RETIN-A) 0.05 % cream APPLY TO ACNE AT BEDTIME Patient taking differently: daily as needed. 01/12/20   Janalyn Harder, MD  triamterene-hydrochlorothiazide (MAXZIDE-25) 37.5-25 MG per tablet Take 1/2 tablets daily in am Patient taking differently: Take 1 tablet daily in am 07/02/12   Moreno-Coll, Adlih, MD  Vitamin D, Ergocalciferol, (DRISDOL) 1.25 MG (50000 UNIT) CAPS capsule Take by mouth. 04/04/22   [provider]      Allergies    Hydrocodone, Oxycodone, Percocet [oxycodone-acetaminophen], Garlic, and Shellfish allergy    Review of Systems   Review of Systems  Physical Exam Updated Vital Signs BP (!) 147/107   Pulse 98   Temp 98 F (36.7 C)   Resp 17   Ht 1.651 m (5\' 5" )   Wt 108.9 kg   SpO2 100%   BMI 39.94 kg/m  Physical Exam Vitals and nursing note reviewed.  Constitutional:      General: She is not in acute distress.    Appearance:  She is well-developed.  HENT:     Head: Normocephalic and atraumatic.     Right Ear: External ear normal.     Left Ear: External ear normal.  Eyes:     General: No visual field deficit or scleral icterus.       Right eye: No discharge.        Left eye: No discharge.     Conjunctiva/sclera: Conjunctivae normal.  Neck:     Trachea: No tracheal deviation.  Cardiovascular:     Rate and Rhythm: Normal rate and regular rhythm.  Pulmonary:     Effort: Pulmonary effort is normal. No respiratory distress.     Breath sounds: Normal breath sounds. No stridor. No wheezing or rales.  Abdominal:     General: Bowel sounds are normal.  There is no distension.     Palpations: Abdomen is soft.     Tenderness: There is no abdominal tenderness. There is no guarding or rebound.  Musculoskeletal:        General: No tenderness or deformity.     Cervical back: Neck supple.  Skin:    General: Skin is warm and dry.     Findings: No rash.  Neurological:     General: No focal deficit present.     Mental Status: She is alert and oriented to person, place, and time.     Cranial Nerves: No cranial nerve deficit (no facial droop, extraocular movements intact, no slurred speech), dysarthria or facial asymmetry.     Sensory: No sensory deficit.     Motor: No abnormal muscle tone, seizure activity or pronator drift.     Coordination: Coordination normal.     Comments:  able to hold both legs off bed for 5 seconds, sensation intact in all extremities,  no left or right sided neglect, normal finger-nose exam bilaterally, no nystagmus noted   Psychiatric:        Mood and Affect: Mood normal.     ED Results / Procedures / Treatments   Labs (all labs ordered are listed, but only abnormal results are displayed) Labs Reviewed - No data to display  EKG None  Radiology No results found.  Procedures Procedures    Medications Ordered in ED Medications  prochlorperazine (COMPAZINE) injection 10 mg (has no administration in time range)  ketorolac (TORADOL) 15 MG/ML injection 15 mg (has no administration in time range)    ED Course/ Medical Decision Making/ A&P Clinical Course as of 05/23/22 1219  Wed May 23, 2022  1157 CBC(!) Normal [JK]  1157 Basic metabolic panel(!) Hypokalemia noted [JK]  1157 CT without acute findings [JK]    Clinical Course User Index [JK] Linwood Dibbles, MD                           Medical Decision Making Problems Addressed: Hypokalemia: acute illness or injury Nonintractable headache, unspecified chronicity pattern, unspecified headache type: acute illness or injury Primary hypertension: acute  illness or injury  Amount and/or Complexity of Data Reviewed Labs: ordered. Decision-making details documented in ED Course. Radiology: ordered and independent interpretation performed.  Risk Prescription drug management.   Patient presents to the ED with complaints of headache elevated blood pressure.  Patient also had some paresthesias.  ED neurologic exam reassuring.  No focal deficits to suggest stroke TIA.  However with her hypertension and her neurologic complaints CT scan was performed to rule out any acute abnormalities.  CT scan is reassuring.  MRI is not available at this facility but I do not think that is necessary at this time.  Patient was treated with migraine cocktail and her symptoms improved.  Is possible her symptoms were related to a migraine type headache.  Patient initially hypertensive but that improved without any intervention.  lAbs also notable for hypokalemia.  Patient is on hydrochlorothiazide.  We will give her a dose of potassium here in the ED and a prescription for potassium.  Discussed importance of outpatient follow-up with PCP   Evaluation and diagnostic testing in the emergency department does not suggest an emergent condition requiring admission or immediate intervention beyond what has been performed at this time.  The patient is safe for discharge and has been instructed to return immediately for worsening symptoms, change in symptoms or any other concerns.       Final Clinical Impression(s) / ED Diagnoses Final diagnoses:  None    Rx / DC Orders ED Discharge Orders     None         Dorie Rank, MD 05/23/22 1222

## 2022-05-23 NOTE — Discharge Instructions (Signed)
Your potassium level was low today.  Take the potassium supplements as prescribed.  Follow-up with your primary doctor to recheck on your blood pressure and your potassium levels.

## 2023-03-08 IMAGING — MG MM DIGITAL SCREENING BILAT W/ TOMO AND CAD
8 series · 8 of 24 positions shown · non-contrast
Comparison: Previous exam(s).

CLINICAL DATA: Screening.

EXAM:
DIGITAL SCREENING BILATERAL MAMMOGRAM WITH TOMOSYNTHESIS AND CAD
TECHNIQUE: Bilateral screening digital craniocaudal and mediolateral oblique
mammograms were obtained. Bilateral screening digital breast
tomosynthesis was performed. The images were evaluated with
computer-aided detection.

[R CC synth-2D]
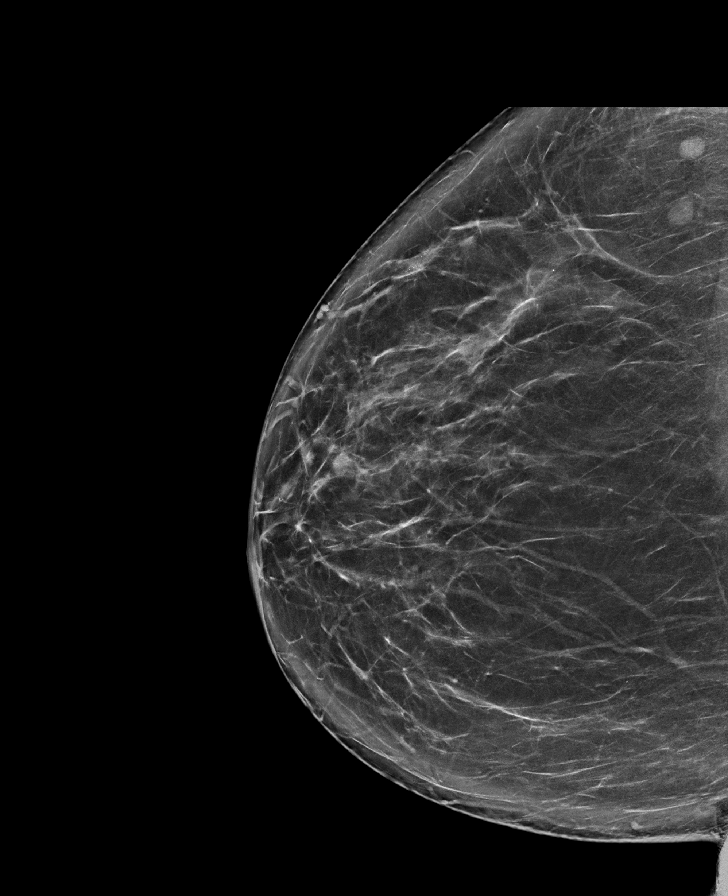

[L CC synth-2D]
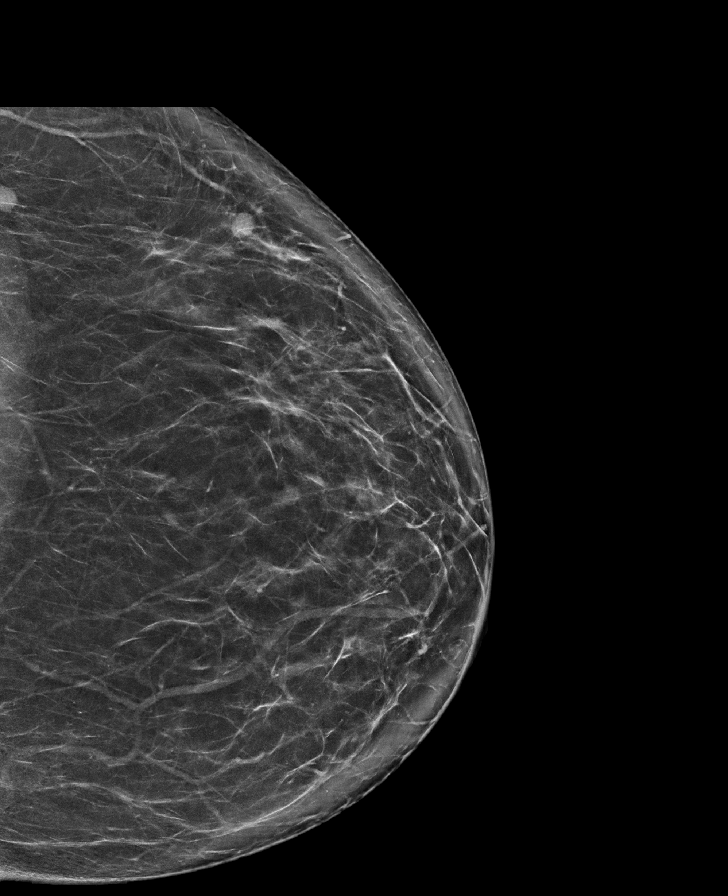

[R MLO synth-2D]
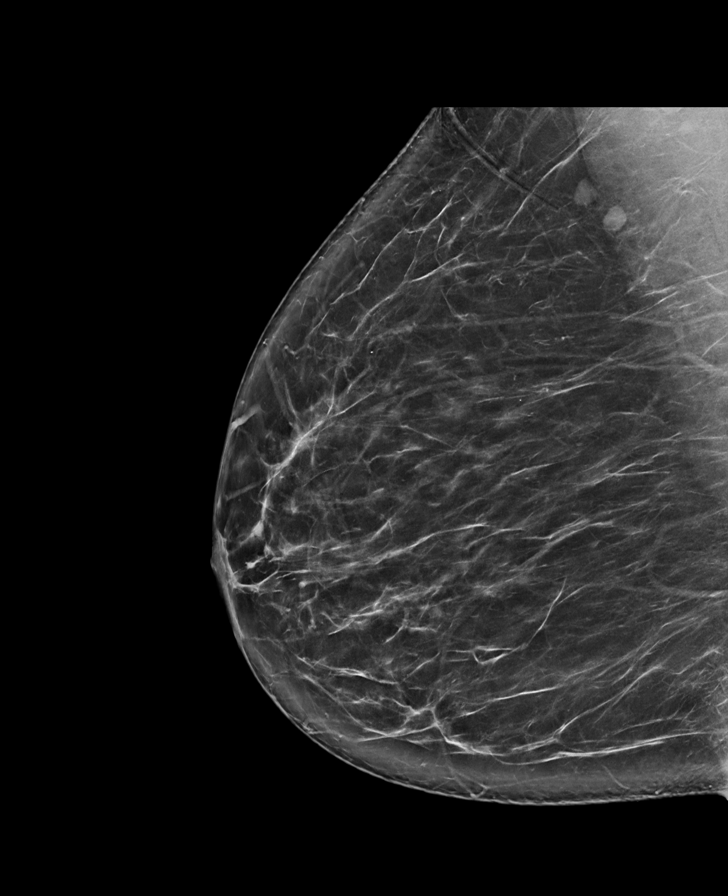

[L MLO synth-2D]
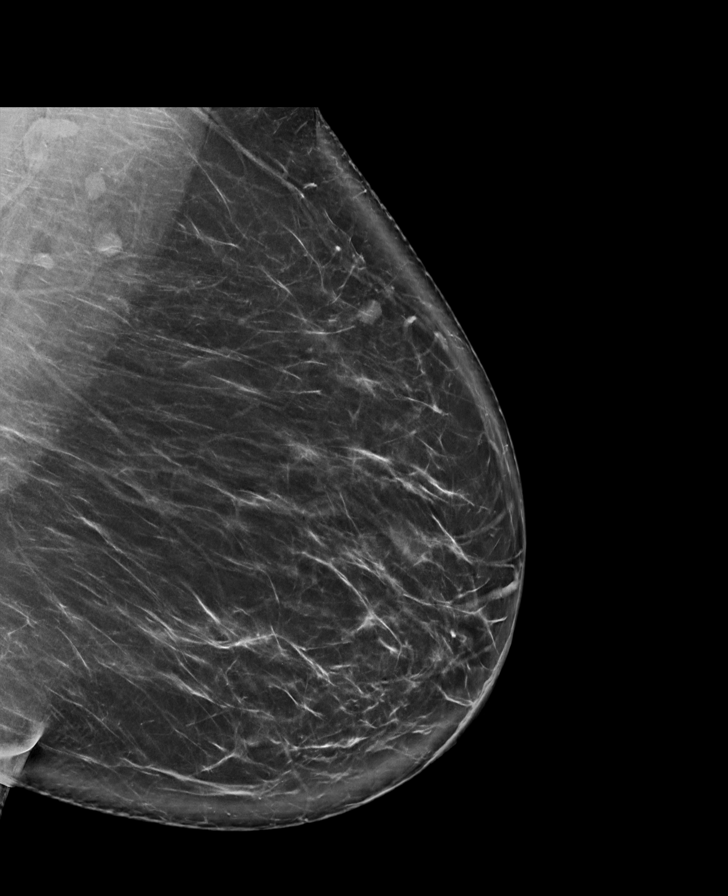

[R MLO tomo · tomo slice 47/93.0]
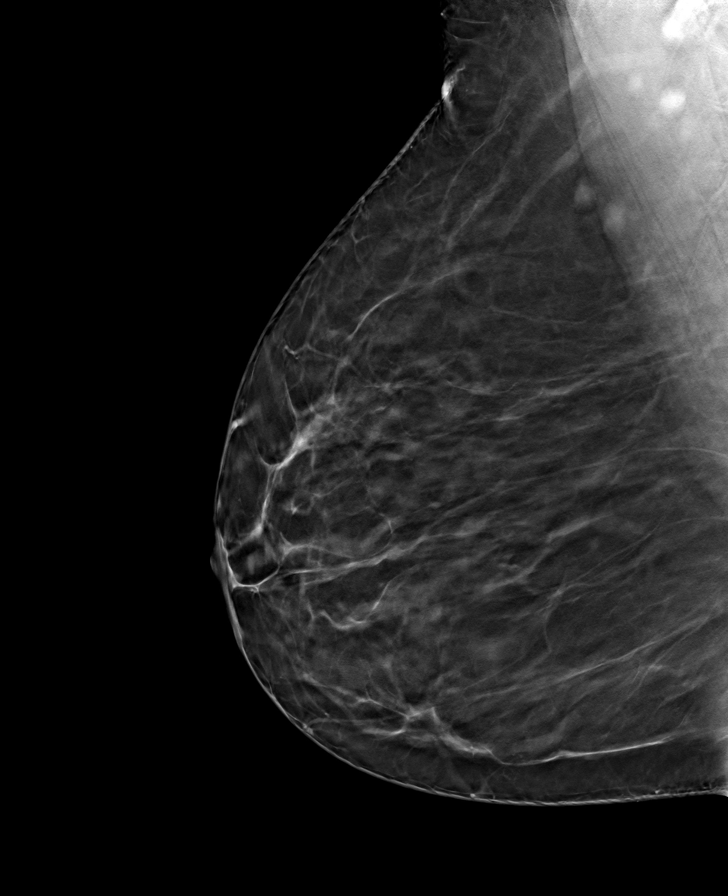

[R CC tomo · tomo slice 47/92.0]
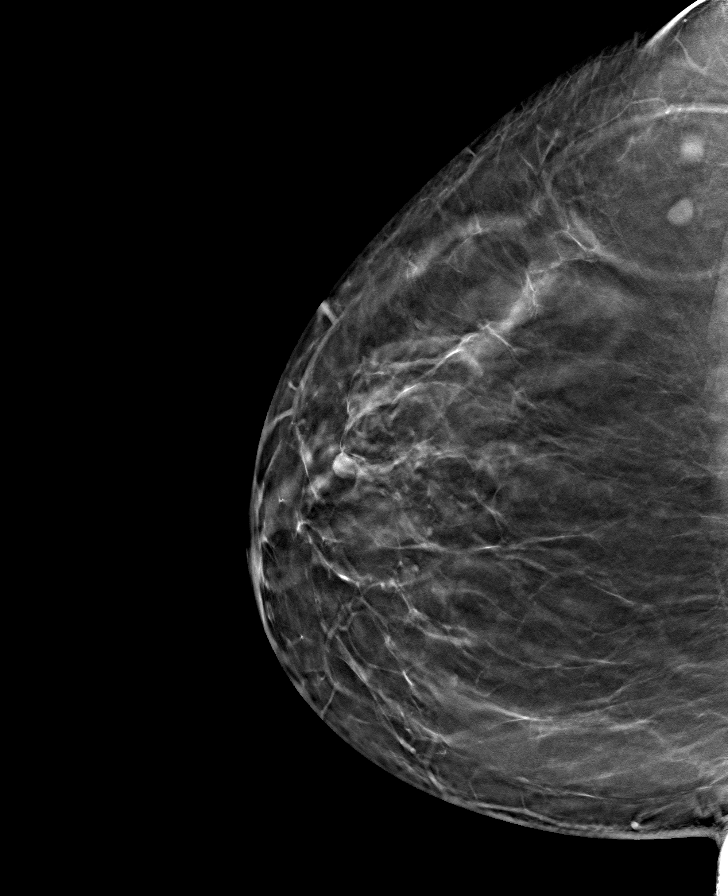

[L CC tomo · tomo slice 47/92.0]
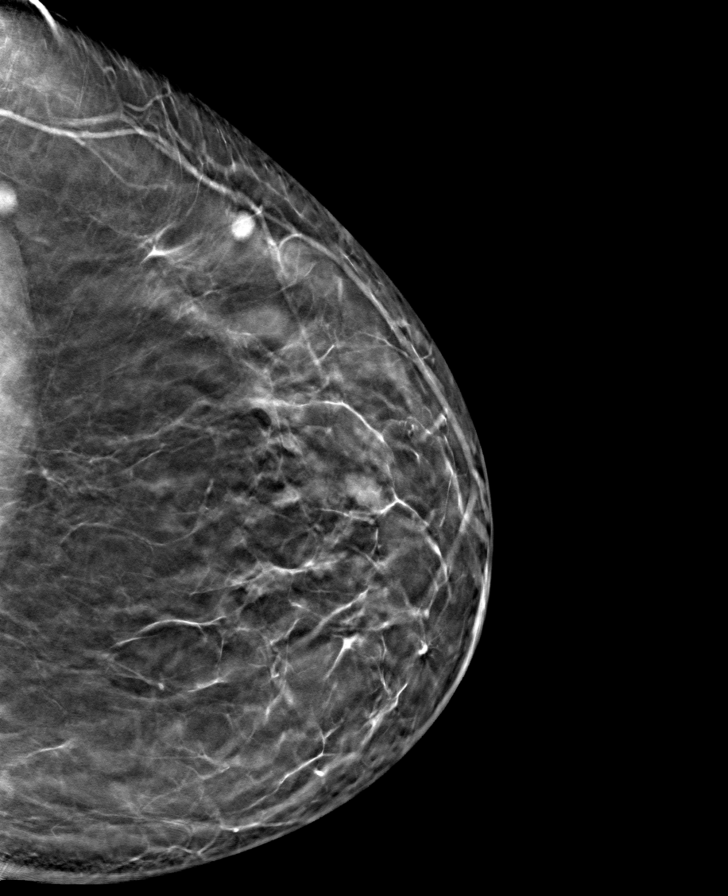

[L MLO tomo · tomo slice 49/98.0]
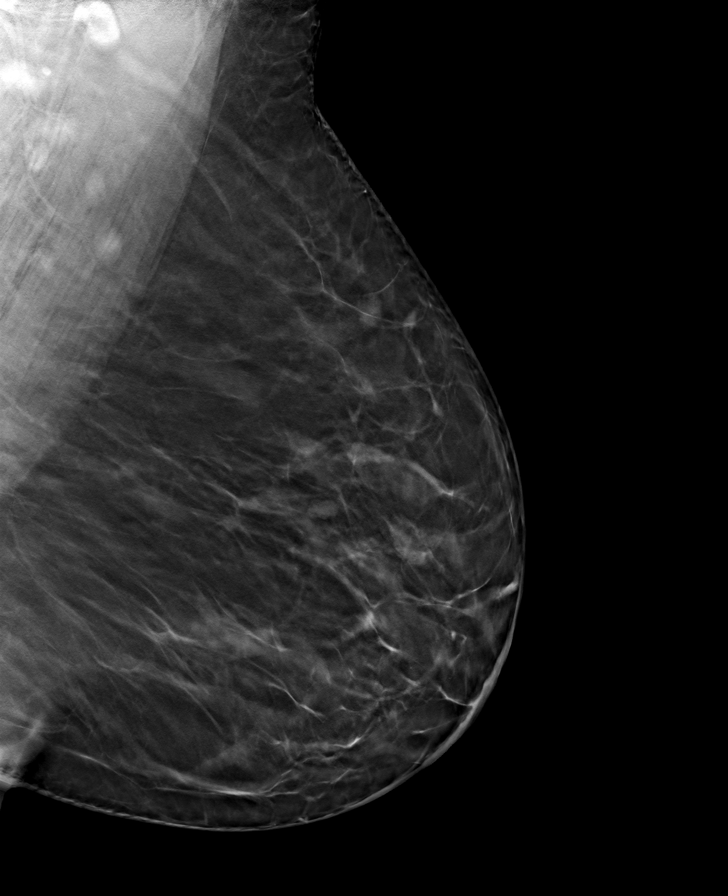

[8 of 24 positions shown; findings below may reference images not displayed]

ACR Breast Density Category b: There are scattered areas of
fibroglandular density.
FINDINGS: There are no findings suspicious for malignancy.
IMPRESSION: No mammographic evidence of malignancy. A result letter of this
screening mammogram will be mailed directly to the patient.

RECOMMENDATION:
Screening mammogram in one year. (Code:51-O-LD2)

BI-RADS CATEGORY  1: Negative.

## 2023-08-28 ENCOUNTER — Other Ambulatory Visit: Payer: Self-pay | Admitting: Obstetrics and Gynecology

## 2023-08-28 DIAGNOSIS — R928 Other abnormal and inconclusive findings on diagnostic imaging of breast: Secondary | ICD-10-CM

## 2023-09-10 ENCOUNTER — Ambulatory Visit
Admission: RE | Admit: 2023-09-10 | Discharge: 2023-09-10 | Disposition: A | Discharge status: Home / Self Care | Payer: BC Managed Care – PPO | Source: Ambulatory Visit | Attending: Obstetrics and Gynecology | Admitting: Obstetrics and Gynecology

## 2023-09-10 DIAGNOSIS — R928 Other abnormal and inconclusive findings on diagnostic imaging of breast: Secondary | ICD-10-CM
